# Patient Record
Sex: Female | Born: 1940 | Race: White | Hispanic: No | Smoking: Never smoker
Health system: Southern US, Community
[De-identification: ages and names within clinical notes are randomized; demographics above are authoritative.]

## PROBLEM LIST (undated history)

## (undated) DIAGNOSIS — M1990 Unspecified osteoarthritis, unspecified site: Secondary | ICD-10-CM

## (undated) DIAGNOSIS — I10 Essential (primary) hypertension: Secondary | ICD-10-CM

## (undated) DIAGNOSIS — I7300 Raynaud's syndrome without gangrene: Secondary | ICD-10-CM

## (undated) DIAGNOSIS — J449 Chronic obstructive pulmonary disease, unspecified: Secondary | ICD-10-CM

## (undated) DIAGNOSIS — J439 Emphysema, unspecified: Secondary | ICD-10-CM

## (undated) DIAGNOSIS — C07 Malignant neoplasm of parotid gland: Secondary | ICD-10-CM

## (undated) DIAGNOSIS — E785 Hyperlipidemia, unspecified: Secondary | ICD-10-CM

## (undated) DIAGNOSIS — J189 Pneumonia, unspecified organism: Secondary | ICD-10-CM

## (undated) DIAGNOSIS — R1310 Dysphagia, unspecified: Secondary | ICD-10-CM

## (undated) DIAGNOSIS — Y92009 Unspecified place in unspecified non-institutional (private) residence as the place of occurrence of the external cause: Secondary | ICD-10-CM

## (undated) DIAGNOSIS — K219 Gastro-esophageal reflux disease without esophagitis: Secondary | ICD-10-CM

## (undated) DIAGNOSIS — W19XXXA Unspecified fall, initial encounter: Secondary | ICD-10-CM

## (undated) DIAGNOSIS — R0981 Nasal congestion: Secondary | ICD-10-CM

## (undated) DIAGNOSIS — C801 Malignant (primary) neoplasm, unspecified: Secondary | ICD-10-CM

## (undated) HISTORY — DX: Gastro-esophageal reflux disease without esophagitis: K21.9

## (undated) HISTORY — PX: APPENDECTOMY: SHX54

## (undated) HISTORY — DX: Dysphagia, unspecified: R13.10

## (undated) HISTORY — DX: Unspecified osteoarthritis, unspecified site: M19.90

## (undated) HISTORY — PX: OTHER SURGICAL HISTORY: SHX169

## (undated) HISTORY — DX: Essential (primary) hypertension: I10

## (undated) HISTORY — DX: Hyperlipidemia, unspecified: E78.5

## (undated) HISTORY — PX: PAROTIDECTOMY: SUR1003

## (undated) HISTORY — DX: Nasal congestion: R09.81

## (undated) HISTORY — PX: TONSILLECTOMY: SUR1361

## (undated) HISTORY — DX: Malignant neoplasm of parotid gland: C07

## (undated) HISTORY — DX: Pneumonia, unspecified organism: J18.9

## (undated) HISTORY — DX: Malignant (primary) neoplasm, unspecified: C80.1

---

## 1999-03-13 ENCOUNTER — Ambulatory Visit (HOSPITAL_COMMUNITY): Admission: RE | Admit: 1999-03-13 | Discharge: 1999-03-13 | Payer: Self-pay | Admitting: Oncology

## 1999-03-13 ENCOUNTER — Encounter (HOSPITAL_COMMUNITY): Payer: Self-pay | Admitting: Oncology

## 1999-04-14 ENCOUNTER — Other Ambulatory Visit: Admission: RE | Admit: 1999-04-14 | Discharge: 1999-04-14 | Payer: Self-pay | Admitting: *Deleted

## 2000-04-21 ENCOUNTER — Other Ambulatory Visit: Admission: RE | Admit: 2000-04-21 | Discharge: 2000-04-21 | Payer: Self-pay | Admitting: *Deleted

## 2000-09-24 ENCOUNTER — Ambulatory Visit (HOSPITAL_COMMUNITY): Admission: RE | Admit: 2000-09-24 | Discharge: 2000-09-24 | Payer: Self-pay | Admitting: *Deleted

## 2001-04-25 ENCOUNTER — Other Ambulatory Visit: Admission: RE | Admit: 2001-04-25 | Discharge: 2001-04-25 | Payer: Self-pay | Admitting: *Deleted

## 2001-07-20 ENCOUNTER — Encounter: Admission: RE | Admit: 2001-07-20 | Discharge: 2001-07-20 | Payer: Self-pay | Admitting: Internal Medicine

## 2001-07-20 ENCOUNTER — Encounter: Payer: Self-pay | Admitting: Internal Medicine

## 2001-08-24 ENCOUNTER — Encounter (INDEPENDENT_AMBULATORY_CARE_PROVIDER_SITE_OTHER): Payer: Self-pay | Admitting: *Deleted

## 2001-08-24 ENCOUNTER — Ambulatory Visit (HOSPITAL_COMMUNITY): Admission: RE | Admit: 2001-08-24 | Discharge: 2001-08-24 | Payer: Self-pay | Admitting: *Deleted

## 2001-09-16 ENCOUNTER — Ambulatory Visit (HOSPITAL_COMMUNITY): Admission: RE | Admit: 2001-09-16 | Discharge: 2001-09-16 | Payer: Self-pay | Admitting: Oncology

## 2001-09-16 ENCOUNTER — Encounter (HOSPITAL_COMMUNITY): Payer: Self-pay | Admitting: Oncology

## 2002-04-27 ENCOUNTER — Other Ambulatory Visit: Admission: RE | Admit: 2002-04-27 | Discharge: 2002-04-27 | Payer: Self-pay | Admitting: *Deleted

## 2003-03-09 ENCOUNTER — Encounter (HOSPITAL_COMMUNITY): Payer: Self-pay | Admitting: Oncology

## 2003-03-09 ENCOUNTER — Ambulatory Visit (HOSPITAL_COMMUNITY): Admission: RE | Admit: 2003-03-09 | Discharge: 2003-03-09 | Payer: Self-pay | Admitting: Oncology

## 2003-05-07 ENCOUNTER — Other Ambulatory Visit: Admission: RE | Admit: 2003-05-07 | Discharge: 2003-05-07 | Payer: Self-pay | Admitting: *Deleted

## 2004-03-10 ENCOUNTER — Ambulatory Visit: Admission: RE | Admit: 2004-03-10 | Discharge: 2004-03-10 | Payer: Self-pay | Admitting: Internal Medicine

## 2004-03-10 ENCOUNTER — Ambulatory Visit (HOSPITAL_COMMUNITY): Admission: RE | Admit: 2004-03-10 | Discharge: 2004-03-10 | Payer: Self-pay | Admitting: Oncology

## 2004-05-07 ENCOUNTER — Other Ambulatory Visit: Admission: RE | Admit: 2004-05-07 | Discharge: 2004-05-07 | Payer: Self-pay | Admitting: *Deleted

## 2004-07-29 ENCOUNTER — Ambulatory Visit (HOSPITAL_COMMUNITY): Admission: RE | Admit: 2004-07-29 | Discharge: 2004-07-29 | Payer: Self-pay | Admitting: Internal Medicine

## 2004-08-26 ENCOUNTER — Ambulatory Visit (HOSPITAL_COMMUNITY): Admission: RE | Admit: 2004-08-26 | Discharge: 2004-08-26 | Payer: Self-pay | Admitting: Internal Medicine

## 2005-02-27 ENCOUNTER — Ambulatory Visit: Payer: Self-pay | Admitting: Oncology

## 2005-03-02 ENCOUNTER — Ambulatory Visit (HOSPITAL_COMMUNITY): Admission: RE | Admit: 2005-03-02 | Discharge: 2005-03-02 | Payer: Self-pay | Admitting: Oncology

## 2005-05-07 ENCOUNTER — Other Ambulatory Visit: Admission: RE | Admit: 2005-05-07 | Discharge: 2005-05-07 | Payer: Self-pay | Admitting: *Deleted

## 2006-01-19 ENCOUNTER — Ambulatory Visit (HOSPITAL_COMMUNITY): Admission: RE | Admit: 2006-01-19 | Discharge: 2006-01-19 | Payer: Self-pay | Admitting: Internal Medicine

## 2006-01-25 ENCOUNTER — Ambulatory Visit (HOSPITAL_COMMUNITY): Admission: RE | Admit: 2006-01-25 | Discharge: 2006-01-25 | Payer: Self-pay | Admitting: *Deleted

## 2006-02-24 ENCOUNTER — Ambulatory Visit: Payer: Self-pay | Admitting: Oncology

## 2006-03-02 LAB — COMPREHENSIVE METABOLIC PANEL
AST: 25 U/L (ref 0–37)
Alkaline Phosphatase: 86 U/L (ref 39–117)
Chloride: 106 mEq/L (ref 96–112)
Creatinine, Ser: 1.03 mg/dL (ref 0.40–1.20)
Glucose, Bld: 65 mg/dL — ABNORMAL LOW (ref 70–99)
Sodium: 143 mEq/L (ref 135–145)

## 2006-03-02 LAB — CBC WITH DIFFERENTIAL/PLATELET
Basophils Absolute: 0 10*3/uL (ref 0.0–0.1)
EOS%: 1.9 % (ref 0.0–7.0)
Eosinophils Absolute: 0.1 10*3/uL (ref 0.0–0.5)
HGB: 12.8 g/dL (ref 11.6–15.9)
LYMPH%: 27.3 % (ref 14.0–48.0)
MCHC: 34.4 g/dL (ref 32.0–36.0)
MONO#: 0.3 10*3/uL (ref 0.1–0.9)
MONO%: 9.5 % (ref 0.0–13.0)
NEUT#: 2 10*3/uL (ref 1.5–6.5)
WBC: 3.4 10*3/uL — ABNORMAL LOW (ref 3.9–10.0)

## 2006-03-02 LAB — LACTATE DEHYDROGENASE: LDH: 160 U/L (ref 94–250)

## 2006-05-13 ENCOUNTER — Other Ambulatory Visit: Admission: RE | Admit: 2006-05-13 | Discharge: 2006-05-13 | Payer: Self-pay | Admitting: *Deleted

## 2007-02-25 ENCOUNTER — Ambulatory Visit: Payer: Self-pay | Admitting: Oncology

## 2007-03-01 ENCOUNTER — Ambulatory Visit (HOSPITAL_COMMUNITY): Admission: RE | Admit: 2007-03-01 | Discharge: 2007-03-01 | Payer: Self-pay | Admitting: Oncology

## 2007-03-01 LAB — COMPREHENSIVE METABOLIC PANEL
ALT: 17 U/L (ref 0–35)
Alkaline Phosphatase: 83 U/L (ref 39–117)
BUN: 29 mg/dL — ABNORMAL HIGH (ref 6–23)
CO2: 25 mEq/L (ref 19–32)
Creatinine, Ser: 1.03 mg/dL (ref 0.40–1.20)
Glucose, Bld: 90 mg/dL (ref 70–99)
Potassium: 4.3 mEq/L (ref 3.5–5.3)
Sodium: 143 mEq/L (ref 135–145)

## 2007-03-01 LAB — CBC WITH DIFFERENTIAL/PLATELET
BASO%: 0.5 % (ref 0.0–2.0)
HGB: 12.3 g/dL (ref 11.6–15.9)
LYMPH%: 24.8 % (ref 14.0–48.0)
MCHC: 34.6 g/dL (ref 32.0–36.0)
MONO%: 9.9 % (ref 0.0–13.0)
NEUT%: 61.3 % (ref 39.6–76.8)
WBC: 3.9 10*3/uL (ref 3.9–10.0)

## 2007-03-01 LAB — LACTATE DEHYDROGENASE: LDH: 150 U/L (ref 94–250)

## 2007-05-19 ENCOUNTER — Other Ambulatory Visit: Admission: RE | Admit: 2007-05-19 | Discharge: 2007-05-19 | Payer: Self-pay | Admitting: *Deleted

## 2007-11-13 ENCOUNTER — Emergency Department (HOSPITAL_COMMUNITY): Admission: EM | Admit: 2007-11-13 | Discharge: 2007-11-13 | Payer: Self-pay | Admitting: Family Medicine

## 2008-02-02 ENCOUNTER — Ambulatory Visit (HOSPITAL_COMMUNITY): Admission: RE | Admit: 2008-02-02 | Discharge: 2008-02-02 | Payer: Self-pay | Admitting: *Deleted

## 2008-02-02 ENCOUNTER — Encounter (INDEPENDENT_AMBULATORY_CARE_PROVIDER_SITE_OTHER): Payer: Self-pay | Admitting: *Deleted

## 2008-02-24 ENCOUNTER — Ambulatory Visit: Payer: Self-pay | Admitting: Oncology

## 2008-02-28 LAB — COMPREHENSIVE METABOLIC PANEL
AST: 20 U/L (ref 0–37)
Alkaline Phosphatase: 85 U/L (ref 39–117)
BUN: 22 mg/dL (ref 6–23)
Calcium: 9.1 mg/dL (ref 8.4–10.5)
Creatinine, Ser: 1.07 mg/dL (ref 0.40–1.20)
Potassium: 4.5 mEq/L (ref 3.5–5.3)
Total Bilirubin: 0.5 mg/dL (ref 0.3–1.2)
Total Protein: 6.4 g/dL (ref 6.0–8.3)

## 2008-02-28 LAB — CBC WITH DIFFERENTIAL/PLATELET
EOS%: 6.7 % (ref 0.0–7.0)
HCT: 37.6 % (ref 34.8–46.6)
HGB: 12.8 g/dL (ref 11.6–15.9)
LYMPH%: 19.7 % (ref 14.0–48.0)
MCH: 31.5 pg (ref 26.0–34.0)
MCHC: 34.2 g/dL (ref 32.0–36.0)
MCV: 92.2 fL (ref 81.0–101.0)
Platelets: 266 10*3/uL (ref 145–400)
RBC: 4.07 10*6/uL (ref 3.70–5.32)
WBC: 4 10*3/uL (ref 3.9–10.0)

## 2008-05-21 ENCOUNTER — Other Ambulatory Visit: Admission: RE | Admit: 2008-05-21 | Discharge: 2008-05-21 | Payer: Self-pay | Admitting: Gynecology

## 2009-03-06 ENCOUNTER — Ambulatory Visit: Payer: Self-pay | Admitting: Oncology

## 2009-03-08 ENCOUNTER — Ambulatory Visit (HOSPITAL_COMMUNITY): Admission: RE | Admit: 2009-03-08 | Discharge: 2009-03-08 | Payer: Self-pay | Admitting: Oncology

## 2009-03-08 LAB — COMPREHENSIVE METABOLIC PANEL WITH GFR
ALT: 18 U/L (ref 0–35)
AST: 21 U/L (ref 0–37)
Albumin: 3.9 g/dL (ref 3.5–5.2)
Alkaline Phosphatase: 75 U/L (ref 39–117)
BUN: 23 mg/dL (ref 6–23)
CO2: 26 meq/L (ref 19–32)
Calcium: 9.1 mg/dL (ref 8.4–10.5)
Chloride: 110 meq/L (ref 96–112)
Creatinine, Ser: 1.08 mg/dL (ref 0.40–1.20)
Glucose, Bld: 78 mg/dL (ref 70–99)
Potassium: 4.6 meq/L (ref 3.5–5.3)
Sodium: 145 meq/L (ref 135–145)
Total Bilirubin: 0.4 mg/dL (ref 0.3–1.2)
Total Protein: 6.2 g/dL (ref 6.0–8.3)

## 2009-03-08 LAB — CBC WITH DIFFERENTIAL/PLATELET
BASO%: 0.4 % (ref 0.0–2.0)
Basophils Absolute: 0 10*3/uL (ref 0.0–0.1)
EOS%: 5.9 % (ref 0.0–7.0)
Eosinophils Absolute: 0.2 10*3/uL (ref 0.0–0.5)
HCT: 36.5 % (ref 34.8–46.6)
HGB: 12.7 g/dL (ref 11.6–15.9)
LYMPH%: 22.5 % (ref 14.0–49.7)
MCH: 32.7 pg (ref 25.1–34.0)
MCHC: 34.8 g/dL (ref 31.5–36.0)
MCV: 93.8 fL (ref 79.5–101.0)
MONO#: 0.4 10*3/uL (ref 0.1–0.9)
MONO%: 11.2 % (ref 0.0–14.0)
NEUT#: 2.2 10*3/uL (ref 1.5–6.5)
NEUT%: 60 % (ref 38.4–76.8)
Platelets: 241 10*3/uL (ref 145–400)
RBC: 3.89 10*6/uL (ref 3.70–5.45)
RDW: 12.7 % (ref 11.2–14.5)
WBC: 3.6 10*3/uL — ABNORMAL LOW (ref 3.9–10.3)
lymph#: 0.8 10*3/uL — ABNORMAL LOW (ref 0.9–3.3)

## 2009-03-08 LAB — LACTATE DEHYDROGENASE: LDH: 180 U/L (ref 94–250)

## 2010-01-06 ENCOUNTER — Encounter: Admission: RE | Admit: 2010-01-06 | Discharge: 2010-01-06 | Payer: Self-pay | Admitting: Internal Medicine

## 2010-02-07 ENCOUNTER — Ambulatory Visit: Payer: Self-pay | Admitting: Thoracic Surgery

## 2010-02-14 ENCOUNTER — Ambulatory Visit: Payer: Self-pay | Admitting: Oncology

## 2010-02-17 LAB — CBC WITH DIFFERENTIAL/PLATELET
Basophils Absolute: 0 10*3/uL (ref 0.0–0.1)
HCT: 39.4 % (ref 34.8–46.6)
MCHC: 33.9 g/dL (ref 31.5–36.0)
MCV: 95.4 fL (ref 79.5–101.0)
MONO#: 0.3 10*3/uL (ref 0.1–0.9)
NEUT#: 4.3 10*3/uL (ref 1.5–6.5)
Platelets: 224 10*3/uL (ref 145–400)
RBC: 4.13 10*6/uL (ref 3.70–5.45)
WBC: 6.1 10*3/uL (ref 3.9–10.3)
lymph#: 1.3 10*3/uL (ref 0.9–3.3)

## 2010-02-17 LAB — COMPREHENSIVE METABOLIC PANEL
AST: 28 U/L (ref 0–37)
Alkaline Phosphatase: 94 U/L (ref 39–117)
CO2: 29 mEq/L (ref 19–32)
Chloride: 105 mEq/L (ref 96–112)
Creatinine, Ser: 0.97 mg/dL (ref 0.40–1.20)
Potassium: 4.3 mEq/L (ref 3.5–5.3)
Sodium: 140 mEq/L (ref 135–145)
Total Protein: 7.1 g/dL (ref 6.0–8.3)

## 2010-02-17 LAB — LACTATE DEHYDROGENASE: LDH: 177 U/L (ref 94–250)

## 2010-06-24 ENCOUNTER — Encounter: Admission: RE | Admit: 2010-06-24 | Discharge: 2010-06-24 | Payer: Self-pay | Admitting: Thoracic Surgery

## 2010-06-25 ENCOUNTER — Ambulatory Visit: Payer: Self-pay | Admitting: Thoracic Surgery

## 2011-02-10 NOTE — Op Note (Signed)
NAME:  Lisa Newman, Lisa Newman NO.:  [id number]   MEDICAL RECORD NO.:  [id number]          PATIENT TYPE:  AMB   LOCATION:  ENDO                         FACILITY:  [facility]   PHYSICIAN:  Georgiana Spinner, M.D.    DATE OF BIRTH:  02/15/1941   DATE OF PROCEDURE:  02/02/2008  DATE OF DISCHARGE:                               OPERATIVE REPORT   PROCEDURE:  Upper endoscopy.   INDICATIONS:  Abdominal pain.   ANESTHESIA:  Fentanyl 50 mcg, Versed 5 mg.   PROCEDURE:  With the patient mildly sedated in the left lateral  decubitus position, the Pentax videoscopic endoscope was inserted in the  mouth passed under direct vision through the esophagus, which appeared  normal.  The distal esophagus showed a possible area of Barrett's, that  was photographed and biopsied.  We entered into the stomach fundus,  body, antrum, duodenal bulb and second portion of the duodenum were  visualized and appeared normal.  From this point the endoscope was  slowly withdrawn, taking circumferential views of the duodenal mucosa;  until the endoscope had been pulled back into the stomach and placed in  retroflexion to view the stomach from below.  The endoscope was  straightened and withdrawn, taking circumferential views of the  remaining gastric and esophageal mucosa.  The patient's vital signs and  pulse oximetry remained stable.  The patient tolerated the procedure  well without apparent complications.   FINDINGS:  Loose wrap of the GE junction around the endoscope,  indicating laxity of the lower esophageal sphincter.  Question of  Barrett's esophagus, biopsied.   PLAN:  Await biopsy report and proceed with CT scan for evaluation of  the continued abdominal pain.           ______________________________  Georgiana Spinner, M.D.     GMO/MEDQ  D:  02/02/2008  T:  02/02/2008  Job:  [id number]

## 2011-02-10 NOTE — Letter (Signed)
June 25, 2010   Massie Maroon, MD  [address]  [place], [place] [address]   Re:  Lisa Newman, SCHULER                  DOB:  Aug 29, 1941   Dear Dr. [name]:   I saw the patient back today and a CT scan showed that the oval-shaped  nodule in the left lower lobe has resolved.  There is some tree-in-bud  opacity in both lower lobes, which go along with inflammatory process.  Her thyroid was not identified here, thus being followed.  I do not  believe there is any other reason to follow her as I think her changes  in her lung appear all to be inflammatory.  I would suggest that she get  at least a chest x-ray once a year.  I will be happy to see her again if  she has any other problems.   Ines Bloomer, M.D.  Electronically Signed   DPB/MEDQ  D:  06/25/2010  T:  06/26/2010  Job:  [id number]

## 2011-02-10 NOTE — Letter (Signed)
Feb 07, 2010   Massie Maroon, MD  [address]  [place], [place] [address]   Re:  Lisa Newman, ACHORN                  DOB:  02/21/1941   Dear Dr. [name]:   I saw the patient today.  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  She recently had some  allergies and a cough and was treated with bronchitis.  Chest x-ray  showed a right upper lobe apical lesion.  CT scan showed a right upper  lobe lesion with questionable scarring and a left lower lobe 5- x 8-mm  indistinct nodule.  She was referred here for evaluation.  She is a  nonsmoker.  She does not drink alcohol on a regular basis.  She has had  no hemoptysis, fever, chills, excessive sputum.   PAST MEDICAL HISTORY:  She has hypertension, hypercholesterolemia.   MEDICATIONS:  1. She takes Avapro 300 mg a day.  2. Pravastatin 20 mg a day.  3. Calcium.  4. Vitamin D.  5. Multivitamins.  6. Aspirin.  7. Singulair 10 mg a day.  8. B12.  9. Omega-3.  10.Vitamin C.   ALLERGIES:  IBUPROFEN causes a rash.   Family history is positive for cardiac disease.   SOCIAL HISTORY:  She is single, retired.  Smoked as a teenager.  Does  not drink alcohol.   REVIEW OF SYSTEMS:  CONSTITUTIONAL:  She is 105 pounds.  She is 5 feet 1  inch.  GENERAL:  Her weight is stable.  CARDIAC:  No angina or atrial fibrillation.  PULMONARY:  Asthma and wheezing.  GI:  Some dysphagia secondary to her previous surgery.  GU:  No kidney disease, dysuria, or frequent urination.  VASCULAR:  No claudication, DVT, TIAs.  NEUROLOGIC:  No dizziness, headaches, blackouts, or seizures.  MUSCULOSKELETAL:  No arthritis.  PSYCHIATRIC:  No depression.  No nervousness.  EYE/ENT:  See history of present illness.  No changes in her eyesight.  HEMATOLOGIC:  No problems with bleeding,  clotting disorders, or anemia.   PHYSICAL EXAMINATION:  General:  She is a well-developed Caucasian female  in no female in no acute distress.  Vital Signs:  Her blood pressure is  153/73, pulse 64, respirations 18, sats were 95%.  Head, Eyes:  Unremarkable.  Nose:  No septal deviation.  Ears:  Tympanic membranes  are intact.  There is a partial resection of her right pinna.  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  Chest:  Clear to  auscultation and percussion.  Heart:  Regular sinus rhythm.  No murmurs.  Abdomen:  Soft.  There is no splenomegaly.  Extremities:  Pulses 2+.  There is no clubbing or edema.   ASSESSMENT AND PLAN:  I think this right upper lobe scar is probably  related to her radiation that she had in 1997.  This small left lower  lobe nodule is the only thing of possible concern, and I think that can  be safely followed.  I will see her again in 4 months with a noncontrast  CT.   Sincerely,   Ines Bloomer, M.D.  Electronically Signed  DPB/MEDQ  D:  02/07/2010  T:  02/07/2010  Job:  [id number]

## 2011-02-13 NOTE — Op Note (Signed)
NAMEMarland Newman  [name], MUCHMORE NO.:  [id number]   MEDICAL RECORD NO.:  [id number]          PATIENT TYPE:  AMB   LOCATION:  ENDO                         FACILITY:  [facility]   PHYSICIAN:  Georgiana Spinner, M.D.    DATE OF BIRTH:  02/02/1941   DATE OF PROCEDURE:  01/25/2006  DATE OF DISCHARGE:                                 OPERATIVE REPORT   PROCEDURE:  Colonoscopy.   INDICATIONS:  Colon polyps.   ANESTHESIA:  Fentanyl 25 mcg, Versed 5 mg.   PROCEDURE:  The patient mildly sedated in the left lateral decubitus  position, the videoscopic colonoscope was inserted in the rectum and passed  under direct vision with pressure applied to the abdomen until we reached  the cecum identified by ileocecal valve and appendiceal orifice, both of  which were photographed and there was transillumination in the very right  lower quadrant.  From this point colonoscope was slowly withdrawn taking  circumferential views of colonic mucosa stopping in the rectum which  appeared normal on direct and retroflexed view. The endoscope was  straightened, withdrawn.  The patient's vital signs, pulse oximeter remained  stable.  The patient tolerated procedure well without complications.   FINDINGS:  Unremarkable examination.  Of note there was somewhat of a lax  anal sphincter.           ______________________________  Georgiana Spinner, M.D.     GMO/MEDQ  D:  01/25/2006  T:  01/25/2006  Job:  [id number]

## 2011-02-13 NOTE — Procedures (Signed)
[facility]  Patient:    MASAE, LUKACS                   MRN: [id number] Proc. Date: 09/24/00 Adm. Date:  [id number] Disc. Date: [id number] Attending:  Sabino Gasser                           Procedure Report  PROCEDURE:  Colonoscopy.  INDICATIONS:  Rectal bleeding.  ANESTHESIA:  Demerol 50 mg, Versed 5 mg.  DESCRIPTION OF PROCEDURE:  With the patient mildly sedated in the left lateral decubitus position, the Olympus videoscopic colonoscope was inserted into the rectum and passed under direct vision to the cecum identified by the ileocecal valve and appendiceal orifice, both of which were photographed.  From this point the colonoscope was slowly withdrawn taking circumferential views of the entire colonic mucosa and stopping only in the rectum which appeared normal in direct and retroflexed views.  The endoscope was straightened and withdrawn.  The patients vital signs and pulse oximetry remained stable.  The patient tolerated the procedure well and without apparent complications.  FINDINGS:  Normal negative colonoscopic examination.  PLAN:  Repeat examination in five years. DD:  09/24/00 TD:  09/24/00 Job: [id number]

## 2011-02-13 NOTE — Procedures (Signed)
[facility]  Patient:    Lisa Newman, Lisa Newman Visit Number: [id number] MRN: [id number]          Service Type: END Location: ENDO Attending Physician:  Sabino Gasser Dictated by:   Sabino Gasser, M.D. Proc. Date: 08/24/01 Admit Date:  08/24/2001                             Procedure Report  PROCEDURE:  Upper endoscopy.  INDICATION:  Abdominal pain.  ANESTHESIA:  Demerol 50 mg, Versed 6 mg.  DESCRIPTION OF PROCEDURE:  With the patient mildly sedated in the left lateral decubitus position, the Olympus videoscopic endoscope was inserted in the mouth, passed under direct vision through the esophagus, which appeared normal, into the stomach. The fundus, body, antrum, duodenal bulb, second portion of duodenum were visualized. From this point, the endoscope was then slowly withdrawn and taken circumferentially to view the entire duodenal mucosa until the endoscope was pulled back into the stomach, placed in retroflexion to view the stomach from below. The endoscope was then straightened, withdrawn, taking circumferential views of the remaining gastric and esophageal mucosa stopping the fundus where there were two folds together that were slightly thickened where the mucosa appeared to be possible effaced. They were further grafted and biopsied. The endoscope was withdrawn. The patients vital signs and pulse oximeter remained stable. The patient tolerated the procedure well without apparent complications.  FINDINGS:  Questionable mild inflammatory changes of two adjacent fundic folds but otherwise an unremarkable examination.  PLAN:  Await biopsy report. The patient will call Dr. Virginia Rochester for results and following with Dr. Virginia Rochester as an outpatient. Dictated by:   Sabino Gasser, M.D. Attending Physician:  Sabino Gasser DD:  08/24/01 TD:  08/24/01 Job: [id number]

## 2011-06-16 ENCOUNTER — Other Ambulatory Visit (HOSPITAL_COMMUNITY): Payer: Self-pay | Admitting: Gastroenterology

## 2011-07-01 ENCOUNTER — Ambulatory Visit (HOSPITAL_COMMUNITY)
Admission: RE | Admit: 2011-07-01 | Discharge: 2011-07-01 | Disposition: A | Discharge status: Home / Self Care | Payer: Medicare Other | Source: Ambulatory Visit | Attending: Gastroenterology | Admitting: Gastroenterology

## 2011-07-01 DIAGNOSIS — R109 Unspecified abdominal pain: Secondary | ICD-10-CM | POA: Insufficient documentation

## 2011-07-01 DIAGNOSIS — Q619 Cystic kidney disease, unspecified: Secondary | ICD-10-CM | POA: Insufficient documentation

## 2011-07-01 DIAGNOSIS — Q6100 Congenital renal cyst, unspecified: Secondary | ICD-10-CM | POA: Insufficient documentation

## 2011-07-01 DIAGNOSIS — N281 Cyst of kidney, acquired: Secondary | ICD-10-CM | POA: Insufficient documentation

## 2011-07-01 MED ORDER — SINCALIDE 5 MCG IJ SOLR: 0.0200 ug/kg | Freq: Once | INTRAMUSCULAR | Status: DC

## 2011-07-01 MED ORDER — TECHNETIUM TC 99M MEBROFENIN IV KIT
5.0000 | PACK | Freq: Once | INTRAVENOUS | Status: AC | PRN
  Administered 2011-07-01: 5 via INTRAVENOUS

## 2011-07-27 ENCOUNTER — Encounter (INDEPENDENT_AMBULATORY_CARE_PROVIDER_SITE_OTHER): Payer: Self-pay | Admitting: General Surgery

## 2011-07-28 ENCOUNTER — Ambulatory Visit (INDEPENDENT_AMBULATORY_CARE_PROVIDER_SITE_OTHER): Payer: Medicare Other | Admitting: General Surgery

## 2011-07-28 ENCOUNTER — Encounter (INDEPENDENT_AMBULATORY_CARE_PROVIDER_SITE_OTHER): Payer: Self-pay | Admitting: General Surgery

## 2011-07-28 VITALS — BP 132/74 | HR 68 | Temp 96.9°F | Resp 16 | Ht 61.5 in | Wt 104.8 lb

## 2011-07-28 DIAGNOSIS — K828 Other specified diseases of gallbladder: Secondary | ICD-10-CM

## 2011-07-28 MED ORDER — ESOMEPRAZOLE MAGNESIUM 40 MG PO CPDR: 40.0000 mg | DELAYED_RELEASE_CAPSULE | Freq: Every day | ORAL | Status: DC

## 2011-07-28 NOTE — Progress Notes (Addendum)
Chief Complaint  Patient presents with  . Other    Eval of gallbladder    HISTORY: Pt presents with epigastric and chest discomfort as well as heartburn.  She also has had some nausea.  She underwent HIDA scan and had a low ejection fraction of 22%. She has had some improvement of symptoms since starting on nexium.  She denies fevers/chills.    Past Medical History  Diagnosis Date  . Asthma   . Pneumonia   . Arthritis   . Hypertension   . Hyperlipidemia   . Carcinoma of parotid gland   . Carcinoma     parotid  . GERD (gastroesophageal reflux disease)   . Osteoporosis   . Constipation   . Nasal congestion   . Trouble swallowing     Past Surgical History  Procedure Date  . Parotid biopsy   . Appendectomy   . Myomectomy   . Tonsillectomy   . Parotidectomy     Current Outpatient Prescriptions  Medication Sig Dispense Refill  . aspirin 81 MG tablet Take 81 mg by mouth daily.        . Calcium Carbonate-Vitamin D (CALTRATE 600+D PO) Take by mouth daily.        . Cholecalciferol (VITAMIN D PO) Take by mouth daily.        . fish oil-omega-3 fatty acids 1000 MG capsule Take 1 g by mouth daily.        . Multiple Vitamin (MULTIVITAMIN PO) Take by mouth daily.        . pravastatin (PRAVACHOL) 20 MG tablet Take 20 mg by mouth daily.        Marland Kitchen BENICAR 40 MG tablet 40 mg daily.       Marland Kitchen omeprazole (PRILOSEC) 20 MG capsule 20 mg daily. Patient cuts pill in half.         Allergies  Allergen Reactions  . Ibuprofen Itching    On bottom of feet and palms of hands.     History reviewed. No pertinent family history.   History   Social History  . Marital Status: Divorced    Spouse Name: N/A    Number of Children: N/A  . Years of Education: N/A   Social History Main Topics  . Smoking status: Former Smoker    Quit date: 07/27/1965  . Smokeless tobacco: Never Used  . Alcohol Use: No  . Drug Use: No  . Sexually Active: None   Other Topics Concern  . None   Social  History Narrative  . None     REVIEW OF SYSTEMS - PERTINENT POSITIVES ONLY: 12 point review of systems negative other than HPI and PMH.  EXAM: Filed Vitals:   07/28/11 1324  BP: 132/74  Pulse: 68  Temp: 96.9 F (36.1 C)  Resp: 16    Gen:  No acute distress.  Well nourished and well groomed.   Neurological: Alert and oriented to person, place, and time. Coordination normal.  Head: Normocephalic and atraumatic.  Eyes: Conjunctivae are normal. Pupils are equal, round, and reactive to light. No scleral icterus.  Neck: Normal range of motion. Neck supple. No tracheal deviation or thyromegaly present.  Cardiovascular: Normal rate, regular rhythm, normal heart sounds and intact distal pulses.  Exam reveals no gallop and no friction rub.  No murmur heard. Respiratory: Effort normal.  No respiratory distress. No chest wall tenderness. Breath sounds normal.  No wheezes, rales or rhonchi.  GI: Soft. Bowel sounds are normal. The abdomen is soft  and nontender.  There is no rebound and no guarding.  Musculoskeletal: Normal range of motion. Extremities are nontender.  Lymphadenopathy: No cervical, preauricular, postauricular or axillary adenopathy is present Skin: Skin is warm and dry. No rash noted. No diaphoresis. No erythema. No pallor. No clubbing, cyanosis, or edema.   Psychiatric: Normal mood and affect. Behavior is normal. Judgment and thought content normal.    RADIOLOGY RESULTS: HIDA w CCK 07/01/2011  Normal patency GB EF 22%, no symptoms w CCK  RUQ ultrasound  07/01/2011 Negative other than simple L kidney cyst.    ASSESSMENT AND PLAN:   Biliary dyskinesia I think patient's abdominal pain is from the gallbladder.  She is tender in the RUQ.   She also, however, has a reflux component.   The patient felt much better on proton pump inhibitors and would like a longer trial before proceeding with cholecystectomy. She also thinks that she doesn't feel quite bad enough to have surgery  at this time.   She is already on a lowfat diet and doesn't eat spicy foods, so food avoidance will not help her be less symptomatic.        Maudry Diego MD Surgical Oncology, General and Endocrine Surgery [facility]      Visit Diagnoses: 1. Biliary dyskinesia     Primary Care Physician: Pearson Grippe, MD, MD

## 2011-07-28 NOTE — Assessment & Plan Note (Signed)
I think patient's abdominal pain is from the gallbladder.  She is tender in the RUQ.   She also, however, has a reflux component.   The patient felt much better on proton pump inhibitors and would like a longer trial before proceeding with cholecystectomy. She also thinks that she doesn't feel quite bad enough to have surgery at this time.   She is already on a lowfat diet and doesn't eat spicy foods, so food avoidance will not help her be less symptomatic.

## 2011-07-28 NOTE — Patient Instructions (Signed)
Try one month of Nexium and follow up with me in 4-6 weeks.

## 2011-09-03 ENCOUNTER — Ambulatory Visit (INDEPENDENT_AMBULATORY_CARE_PROVIDER_SITE_OTHER): Payer: Medicare Other | Admitting: General Surgery

## 2011-09-03 ENCOUNTER — Encounter (INDEPENDENT_AMBULATORY_CARE_PROVIDER_SITE_OTHER): Payer: Self-pay | Admitting: General Surgery

## 2011-09-03 VITALS — BP 128/70 | HR 68 | Temp 97.8°F | Resp 12 | Ht 61.5 in | Wt 104.2 lb

## 2011-09-03 DIAGNOSIS — K828 Other specified diseases of gallbladder: Secondary | ICD-10-CM

## 2011-09-03 NOTE — Patient Instructions (Signed)
Continue nexium or double dose over the counter prilosec.  Follow up with me in April

## 2011-09-03 NOTE — Progress Notes (Signed)
HISTORY: Pt is doing better since she started on PPI.  Her symptoms now are rare episodes of nausea and occasional soreness at the navel.  She is not having any fevers/chills.  She continues on an lowfat diet.  She does not eat spicy or fatty foods.    PERTINENT REVIEW OF SYSTEMS: Otherwise negative.    EXAM: Head: Pt with sequelae of extensive surgery on face.   Eyes:  Conjunctivae are normal.  Neck:  Normal range of motion. Neck supple.  Resp: No respiratory distress, normal effort. Abd:  Abdomen is soft, non distended and non tender in the RUQ. No masses are palpable.  There is no rebound and no guarding. No umbilical hernia is seen.   Neurological: Alert and oriented to person, place, and time. Coordination normal.  Skin: Skin is warm and dry. No rash noted. No diaphoretic. No erythema. No pallor.  Psychiatric: Normal mood and affect. Normal behavior. Judgment and thought content normal.    ASSESSMENT AND PLAN:   Biliary dyskinesia Pt with improvement since starting PPI. May not have to have cholecystectomy. Pt would like to touch base in spring to determine if she needs cholecystectomy.    Maudry Diego, MD Surgical Oncology, General & Endocrine Surgery [facility], Hubbard Hartshorn, MD, MD Massie Maroon., MD

## 2011-09-03 NOTE — Assessment & Plan Note (Signed)
Pt with improvement since starting PPI. May not have to have cholecystectomy. Pt would like to touch base in spring to determine if she needs cholecystectomy.

## 2011-09-17 DIAGNOSIS — H95819 Postprocedural stenosis of unspecified external ear canal: Secondary | ICD-10-CM | POA: Insufficient documentation

## 2011-09-17 DIAGNOSIS — C07 Malignant neoplasm of parotid gland: Secondary | ICD-10-CM | POA: Insufficient documentation

## 2012-02-05 ENCOUNTER — Encounter (INDEPENDENT_AMBULATORY_CARE_PROVIDER_SITE_OTHER): Payer: Self-pay | Admitting: General Surgery

## 2012-02-05 ENCOUNTER — Ambulatory Visit (INDEPENDENT_AMBULATORY_CARE_PROVIDER_SITE_OTHER): Payer: Medicare Other | Admitting: General Surgery

## 2012-02-05 VITALS — BP 134/68 | HR 73 | Temp 97.9°F | Ht 61.5 in | Wt 103.2 lb

## 2012-02-05 DIAGNOSIS — K828 Other specified diseases of gallbladder: Secondary | ICD-10-CM

## 2012-02-05 NOTE — Progress Notes (Signed)
HISTORY: Patient is a 71 year old female last saw last fall for evaluation of biliary dyskinesia. She had recently started on a proton pump inhibitor, and had started feeling significantly better. Since that time, she has stopped taking the Nexium and stopped eating so many tomatoes. She states at that time she was having so much discomfort she was eating a significant quantity of tomatoes. She described the pain as primarily being at the base of her navel.  This is primarily gone. She does not haveany nausea or discomfort after eating. She denies fevers and chills.   PERTINENT REVIEW OF SYSTEMS: Otherwise negative.    EXAM: Head: Normocephalic and post surgical.  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   Eyes:  Conjunctivae are normal. Pupils are equal, round, and reactive to light. No scleral icterus.   Resp: No respiratory distress, normal effort. Abd:  Abdomen is soft, non distended and non tender. No masses are palpable.  There is no rebound and no guarding. There is no evidence of umbilical hernia or epigastric hernia.   Neurological: Alert and oriented to person, place, and time. Coordination normal.  Skin: Skin is warm and dry. No rash noted. No diaphoretic. No erythema. No pallor.  Psychiatric: Normal mood and affect. Normal behavior. Judgment and thought content normal.      ASSESSMENT AND PLAN:   Biliary dyskinesia Currently asymptomatic.  Advised of symptoms of chronic cholecystitis. Advised to call if problems recur.  Follow up as needed.         Maudry Diego, MD Surgical Oncology, General & Endocrine Surgery [facility], Hubbard Hartshorn, MD, MD Massie Maroon., MD

## 2012-02-05 NOTE — Assessment & Plan Note (Signed)
Currently asymptomatic.  Advised of symptoms of chronic cholecystitis. Advised to call if problems recur.  Follow up as needed.

## 2012-02-05 NOTE — Patient Instructions (Signed)
Call if upper or right abdominal pain returns.

## 2012-03-24 ENCOUNTER — Telehealth: Payer: Self-pay | Admitting: Oncology

## 2012-03-24 NOTE — Telephone Encounter (Signed)
pt had called to make a f/u appt,l/m with first avail appt   aom

## 2012-05-19 ENCOUNTER — Encounter: Payer: Self-pay | Admitting: Family

## 2012-05-19 ENCOUNTER — Ambulatory Visit: Payer: Medicare Other | Admitting: Oncology

## 2012-05-19 ENCOUNTER — Ambulatory Visit (HOSPITAL_BASED_OUTPATIENT_CLINIC_OR_DEPARTMENT_OTHER): Payer: Medicare Other | Admitting: Family

## 2012-05-19 VITALS — BP 97/65 | HR 79 | Temp 97.0°F | Resp 18 | Ht 61.5 in | Wt 102.0 lb

## 2012-05-19 DIAGNOSIS — Z85819 Personal history of malignant neoplasm of unspecified site of lip, oral cavity, and pharynx: Secondary | ICD-10-CM

## 2012-05-19 DIAGNOSIS — Z85818 Personal history of malignant neoplasm of other sites of lip, oral cavity, and pharynx: Secondary | ICD-10-CM | POA: Insufficient documentation

## 2012-05-19 NOTE — Patient Instructions (Addendum)
Patient ID: Lisa Newman,   DOB: November 23, 1940,  MRN: [id number]   [facility] Discharge Instructions  RECOMMENDATIONS MAD BY THE CONSULTANT AND ANY TEST RESULT(S) WILL BE FORWARDED TO YOU REFERRING DOCTOR   EXAM FINDINGS BY NURSE PRACTITIONER TODAY TO REPORT TO THE CLINIC OR PRIMARY PROVIDER:    Your Current Medications Are: Current Outpatient Prescriptions  Medication Sig Dispense Refill  . aspirin 81 MG tablet Take 81 mg by mouth daily.        Marland Kitchen BENICAR 40 MG tablet 40 mg daily.       . Calcium Carbonate-Vitamin D (CALTRATE 600+D PO) Take by mouth daily.        . Cholecalciferol (VITAMIN D PO) Take by mouth daily.        Marland Kitchen co-enzyme Q-10 50 MG capsule Take 100 mg by mouth daily.      Marland Kitchen diltiazem (CARDIZEM) 30 MG tablet       . fexofenadine (ALLEGRA) 180 MG tablet Take 180 mg by mouth as needed.      . fish oil-omega-3 fatty acids 1000 MG capsule Take 1 g by mouth daily.        . Multiple Vitamin (MULTIVITAMIN PO) Take by mouth daily.        . pravastatin (PRAVACHOL) 20 MG tablet Take 20 mg by mouth daily.        . sertraline (ZOLOFT) 25 MG tablet Take 12.5 mg by mouth daily.      . vitamin C (ASCORBIC ACID) 500 MG tablet Take 500 mg by mouth daily.         INSTRUCTIONS GIVEN, DISCUSSED AND FOLLOW-UP: Call us if you notice any changes or have concerns.  I acknowledge that I have been informed and understand all the instructions given to me and have received a copy.  I do not have any further questions at this time, but I understand that I may call the [facility] at [phone] during business hours should I have any further questions or need assistance in obtaining follow-up care.   05/19/2012, 10:43 AM

## 2012-05-19 NOTE — Progress Notes (Signed)
Patient ID: Lisa Newman, female   DOB: 15-Dec-1940, 71 y.o.   MRN: [id number] CSN: [id number]  Cc:  Dr. Lytle Michaels. Lisa Batten [facility]) Dr. Denice Bors [facility])  Problem List: Lisa Newman is a 71 y.o. Caucasian female with a problem list consisting of:  1.  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  Initially the patient was treated with radiation and subsequently chemotherapy and radical surgery by Dr. Leland Johns.  Surgery was on 11/11/1995.  The original diagnosis was in June,1996. 2.  Wheezing 3.  HTN 4.  Dyslipidemia 5.  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  Abnormal chest CT 01/06/10 - right apex LLL nodule.  Subsequent chest xray on 06/24/10 showed resolution of the oval shaped nodule in the posterior LLL.   Lisa Newman was last seen in our office on 02/17/2010 by Dr. [name].  At that time, the patient had been 15 years from the time of diagnosis without evidence of recurrence and it was requested by the patient to not make any further appointments for follow-up.  The patient recently called our office and made an appointment.  The patient stated that she had two episodes of pneumonia (April, 2012 and June, 2013 not requiring hospitalization) and was worried that she has lung cancer.  She then stated that she had another xray taken by her PCP, Dr. [name] on 05/12/2012 and she was told that her xray was ok by her PCP, but she presents to the our office with a CD of her x-ray and requested that Dr. [name] read it.  She stated that she has not received a copy of the xray report and she is worried she has lung cancer.  The patient went on to mention that she is under a tremendous amount of stress as she is the primary caregiver to her 31 year-old mother.  The patient otherwise is without complaint and states that she  follows up regularly with Dr. [name], PCP and Dr. Hezzie Bump, Surgeon at [facility]  Past Medical History: Past Medical History  Diagnosis Date  . Asthma   . Pneumonia   . Arthritis   . Hypertension   . Hyperlipidemia   . Carcinoma of parotid gland   . Carcinoma     parotid  . GERD (gastroesophageal reflux disease)   . Osteoporosis   . Constipation   . Nasal congestion   . Trouble swallowing     Surgical History: Past Surgical History  Procedure Date  . Parotid biopsy   . Appendectomy   . Myomectomy   . Tonsillectomy   . Parotidectomy     Current Medications: Current Outpatient Prescriptions  Medication Sig Dispense Refill  . aspirin 81 MG tablet Take 81 mg by mouth daily.        Marland Kitchen BENICAR 40 MG tablet 40 mg daily.       . Calcium Carbonate-Vitamin D (CALTRATE 600+D PO) Take by mouth daily.        . Cholecalciferol (VITAMIN D PO) Take by mouth daily.        Marland Kitchen co-enzyme Q-10 50 MG capsule Take 100 mg by mouth daily.      Marland Kitchen diltiazem (CARDIZEM) 30 MG tablet       . fexofenadine (ALLEGRA) 180 MG tablet Take 180 mg by mouth as needed.      Marland Kitchen  fish oil-omega-3 fatty acids 1000 MG capsule Take 1 g by mouth daily.        . Multiple Vitamin (MULTIVITAMIN PO) Take by mouth daily.        . pravastatin (PRAVACHOL) 20 MG tablet Take 20 mg by mouth daily.        . sertraline (ZOLOFT) 25 MG tablet Take 12.5 mg by mouth daily.      . vitamin C (ASCORBIC ACID) 500 MG tablet Take 500 mg by mouth daily.        Allergies: Allergies  Allergen Reactions  . Ibuprofen Itching    On bottom of feet and palms of hands.     Family History: Family History  Problem Relation Age of Onset  . Heart disease Brother     heart attack  . Cancer Paternal Aunt     pt unaware of what kind    Social History: History  Substance Use Topics  . Smoking status: Never Smoker   . Smokeless tobacco: Never Used  . Alcohol Use: No    Review of Systems: 10 Point  review of systems was completed and is negative except as noted above.   Physical Exam:   Blood pressure 97/65, pulse 79, temperature 97 F (36.1 C), temperature source Oral, resp. rate 18, height 5' 1.5" (1.562 m), weight 102 lb (46.267 kg).  General appearance: Alert, cooperative, well nourished, mild distress Head: Normocephalic, without obvious abnormality, atraumatic Eyes: Conjunctivae/corneas clear, PERRLA, EOMI, asymmetric right eye Neck: No adenopathy, no tenderness, radiation changes visible, asymmetric Resp: Clear to auscultation bilaterally Cardio: Regular rate and rhythm, S1, S2 normal, no murmur, click, rub or gallop GI: Soft, non-tender, slight distention, hypoactive bowel sounds, no organomegaly Extremities: Extremities normal, atraumatic, no cyanosis or edema Lymph nodes: Supraclavicular and axillary nodes are normal Neurologic: Grossly normal   Laboratory Data: No results found for this or any previous visit (from the past 48 hour(s)).   Imaging Studies: 05/12/2012 Chest xray 2 views:  Impression:  Near complete resolution of the right middle lobe consolidation  Impression/Plan: Lisa Newman is now 17 years from the time of diagnosis without evidence of recurrence. The results of the 05/12/2012 chest xray were discussed with the patient.  The patient will follow up with [place] in the future on an as needed basis, if she has concerns or any changes.    Lisa Marrow NP-C 05/19/2012, 10:50 AM

## 2012-05-20 ENCOUNTER — Encounter: Payer: Self-pay | Admitting: Oncology

## 2012-05-20 ENCOUNTER — Telehealth: Payer: Self-pay

## 2012-05-20 NOTE — Telephone Encounter (Signed)
lvm that last x-rays were reviewed by DSM and radiologist and there is no evidence of recurrent tumor.

## 2012-05-20 NOTE — Progress Notes (Signed)
This patient was seen by [place] on 05/19/2012. She requested that we review recent chest x-rays have been carried out due to a suspected pneumonia in the right middle lobe originally seen on the chest x-ray of 03/18/2012. More recent chest x-rays from 04/04/2012 and 03/18/2012 have shown resolution of this right middle lobe consolidation. I reviewed these 3 x-rays with the radiologist. There is no evidence for recurrent tumor. I will have my desk nurse call the patient with this information and reassurance.

## 2013-03-29 ENCOUNTER — Other Ambulatory Visit: Payer: Self-pay | Admitting: Physical Medicine and Rehabilitation

## 2013-03-29 DIAGNOSIS — M5136 Other intervertebral disc degeneration, lumbar region: Secondary | ICD-10-CM

## 2013-04-03 ENCOUNTER — Ambulatory Visit
Admission: RE | Admit: 2013-04-03 | Discharge: 2013-04-03 | Disposition: A | Discharge status: Home / Self Care | Payer: Medicare Other | Source: Ambulatory Visit | Attending: Physical Medicine and Rehabilitation | Admitting: Physical Medicine and Rehabilitation

## 2013-04-03 DIAGNOSIS — M5136 Other intervertebral disc degeneration, lumbar region: Secondary | ICD-10-CM

## 2013-04-07 ENCOUNTER — Other Ambulatory Visit (HOSPITAL_COMMUNITY): Payer: Self-pay | Admitting: Interventional Radiology

## 2013-04-07 DIAGNOSIS — IMO0002 Reserved for concepts with insufficient information to code with codable children: Secondary | ICD-10-CM

## 2013-04-07 DIAGNOSIS — M549 Dorsalgia, unspecified: Secondary | ICD-10-CM

## 2013-04-11 ENCOUNTER — Ambulatory Visit (HOSPITAL_COMMUNITY)
Admission: RE | Admit: 2013-04-11 | Discharge: 2013-04-11 | Disposition: A | Discharge status: Home / Self Care | Payer: Medicare Other | Source: Ambulatory Visit | Attending: Interventional Radiology | Admitting: Interventional Radiology

## 2013-04-11 DIAGNOSIS — M549 Dorsalgia, unspecified: Secondary | ICD-10-CM

## 2013-04-11 DIAGNOSIS — S22000A Wedge compression fracture of unspecified thoracic vertebra, initial encounter for closed fracture: Secondary | ICD-10-CM | POA: Insufficient documentation

## 2013-04-11 DIAGNOSIS — IMO0002 Reserved for concepts with insufficient information to code with codable children: Secondary | ICD-10-CM

## 2014-09-13 ENCOUNTER — Other Ambulatory Visit (HOSPITAL_COMMUNITY): Payer: Self-pay | Admitting: Respiratory Therapy

## 2014-09-13 DIAGNOSIS — J441 Chronic obstructive pulmonary disease with (acute) exacerbation: Secondary | ICD-10-CM

## 2014-09-17 ENCOUNTER — Encounter (HOSPITAL_COMMUNITY): Payer: Medicare Other

## 2014-10-01 ENCOUNTER — Ambulatory Visit (HOSPITAL_COMMUNITY)
Admission: RE | Admit: 2014-10-01 | Discharge: 2014-10-01 | Disposition: A | Discharge status: Home / Self Care | Payer: Medicare Other | Source: Ambulatory Visit | Attending: Internal Medicine | Admitting: Internal Medicine

## 2014-10-01 ENCOUNTER — Encounter (INDEPENDENT_AMBULATORY_CARE_PROVIDER_SITE_OTHER): Payer: Self-pay

## 2014-10-01 DIAGNOSIS — J441 Chronic obstructive pulmonary disease with (acute) exacerbation: Secondary | ICD-10-CM | POA: Insufficient documentation

## 2014-10-01 MED ORDER — ALBUTEROL SULFATE (2.5 MG/3ML) 0.083% IN NEBU
2.5000 mg | INHALATION_SOLUTION | Freq: Once | RESPIRATORY_TRACT | Status: AC
  Administered 2014-10-01: 2.5 mg via RESPIRATORY_TRACT

## 2014-10-02 LAB — PULMONARY FUNCTION TEST
DL/VA % pred: 98 %
DL/VA: 4.46 ml/min/mmHg/L
DLCO UNC % PRED: 75 %
DLCO unc: 16.31 ml/min/mmHg
FEF 25-75 POST: 1.43 L/s
FEF 25-75 Pre: 0.94 L/sec
FEF2575-%CHANGE-POST: 52 %
FEF2575-%PRED-POST: 83 %
FEF2575-%PRED-PRE: 54 %
FEV1-%Change-Post: 11 %
FEV1-%Pred-Post: 77 %
FEV1-%Pred-Pre: 68 %
FEV1-POST: 1.57 L
FEV1-Pre: 1.41 L
FEV1FVC-%CHANGE-POST: 3 %
FEV1FVC-%PRED-PRE: 93 %
FEV6-%CHANGE-POST: 9 %
FEV6-%Pred-Post: 83 %
FEV6-%Pred-Pre: 75 %
FEV6-Post: 2.15 L
FEV6-Pre: 1.95 L
FEV6FVC-%Change-Post: 1 %
FEV6FVC-%PRED-POST: 105 %
FEV6FVC-%Pred-Pre: 104 %
FVC-%CHANGE-POST: 8 %
FVC-%PRED-POST: 79 %
FVC-%PRED-PRE: 72 %
FVC-PRE: 1.98 L
FVC-Post: 2.15 L
POST FEV1/FVC RATIO: 73 %
PRE FEV1/FVC RATIO: 71 %
Post FEV6/FVC ratio: 100 %
Pre FEV6/FVC Ratio: 99 %
RV % pred: 216 %
RV: 4.58 L
TLC % PRED: 138 %
TLC: 6.59 L

## 2014-11-05 ENCOUNTER — Encounter (HOSPITAL_COMMUNITY): Payer: Self-pay | Admitting: *Deleted

## 2014-11-06 ENCOUNTER — Encounter (HOSPITAL_COMMUNITY): Payer: Self-pay | Admitting: *Deleted

## 2014-11-13 ENCOUNTER — Encounter (HOSPITAL_COMMUNITY): Payer: Self-pay | Admitting: Anesthesiology

## 2014-11-13 ENCOUNTER — Ambulatory Visit (HOSPITAL_COMMUNITY): Admission: RE | Admit: 2014-11-13 | Payer: Medicare Other | Source: Ambulatory Visit | Admitting: Gastroenterology

## 2014-11-13 HISTORY — DX: Chronic obstructive pulmonary disease, unspecified: J44.9

## 2014-11-13 HISTORY — DX: Raynaud's syndrome without gangrene: I73.00

## 2014-11-13 HISTORY — DX: Emphysema, unspecified: J43.9

## 2014-11-13 SURGERY — COLONOSCOPY WITH PROPOFOL
Anesthesia: Monitor Anesthesia Care

## 2014-11-13 NOTE — Anesthesia Preprocedure Evaluation (Deleted)
Anesthesia Evaluation  Patient identified by MRN, date of birth, ID band Patient awake    Reviewed: Allergy & Precautions, H&P , NPO status , Patient's Chart, lab work & pertinent test results  Airway Mallampati: II  TM Distance: >3 FB Neck ROM: full    Dental no notable dental hx.    Pulmonary asthma , COPD breath sounds clear to auscultation  Pulmonary exam normal       Cardiovascular hypertension, Pt. on medications Rhythm:regular Rate:Normal     Neuro/Psych Raynaud's disease negative neurological ROS  negative psych ROS   GI/Hepatic negative GI ROS, Neg liver ROS,   Endo/Other  negative endocrine ROS  Renal/GU negative Renal ROS  negative genitourinary   Musculoskeletal   Abdominal   Peds  Hematology negative hematology ROS (+)   Anesthesia Other Findings Parotid cancer  Reproductive/Obstetrics negative OB ROS                             Anesthesia Physical Anesthesia Plan  ASA: III  Anesthesia Plan: MAC   Post-op Pain Management:    Induction:   Airway Management Planned:   Additional Equipment:   Intra-op Plan:   Post-operative Plan:   Informed Consent: I have reviewed the patients History and Physical, chart, labs and discussed the procedure including the risks, benefits and alternatives for the proposed anesthesia with the patient or authorized representative who has indicated his/her understanding and acceptance.   Dental Advisory Given  Plan Discussed with: CRNA and Surgeon  Anesthesia Plan Comments:         Anesthesia Quick Evaluation

## 2014-11-15 ENCOUNTER — Other Ambulatory Visit: Payer: Self-pay | Admitting: Internal Medicine

## 2014-11-15 DIAGNOSIS — R1033 Periumbilical pain: Secondary | ICD-10-CM

## 2014-11-15 DIAGNOSIS — R52 Pain, unspecified: Secondary | ICD-10-CM

## 2014-11-16 ENCOUNTER — Ambulatory Visit
Admission: RE | Admit: 2014-11-16 | Discharge: 2014-11-16 | Disposition: A | Discharge status: Home / Self Care | Payer: Medicare Other | Source: Ambulatory Visit | Attending: Internal Medicine | Admitting: Internal Medicine

## 2014-11-16 DIAGNOSIS — R52 Pain, unspecified: Secondary | ICD-10-CM

## 2014-11-16 DIAGNOSIS — R1033 Periumbilical pain: Secondary | ICD-10-CM

## 2014-11-16 MED ORDER — IOHEXOL 300 MG/ML  SOLN
75.0000 mL | Freq: Once | INTRAMUSCULAR | Status: AC | PRN
  Administered 2014-11-16: 75 mL via INTRAVENOUS

## 2014-12-19 ENCOUNTER — Other Ambulatory Visit: Payer: Self-pay | Admitting: Gastroenterology

## 2015-01-04 ENCOUNTER — Encounter (HOSPITAL_COMMUNITY): Payer: Self-pay | Admitting: *Deleted

## 2015-01-15 ENCOUNTER — Ambulatory Visit (HOSPITAL_COMMUNITY): Payer: Medicare Other | Admitting: Anesthesiology

## 2015-01-15 ENCOUNTER — Encounter (HOSPITAL_COMMUNITY)
Admission: RE | Disposition: A | Discharge status: Home / Self Care | Payer: Self-pay | Source: Ambulatory Visit | Attending: Gastroenterology

## 2015-01-15 ENCOUNTER — Ambulatory Visit (HOSPITAL_COMMUNITY)
Admission: RE | Admit: 2015-01-15 | Discharge: 2015-01-15 | Disposition: A | Discharge status: Home / Self Care | Payer: Medicare Other | Source: Ambulatory Visit | Attending: Gastroenterology | Admitting: Gastroenterology

## 2015-01-15 ENCOUNTER — Encounter (HOSPITAL_COMMUNITY): Payer: Self-pay | Admitting: Anesthesiology

## 2015-01-15 DIAGNOSIS — I10 Essential (primary) hypertension: Secondary | ICD-10-CM | POA: Diagnosis not present

## 2015-01-15 DIAGNOSIS — Z1211 Encounter for screening for malignant neoplasm of colon: Secondary | ICD-10-CM | POA: Insufficient documentation

## 2015-01-15 DIAGNOSIS — E785 Hyperlipidemia, unspecified: Secondary | ICD-10-CM | POA: Diagnosis not present

## 2015-01-15 DIAGNOSIS — M1990 Unspecified osteoarthritis, unspecified site: Secondary | ICD-10-CM | POA: Insufficient documentation

## 2015-01-15 DIAGNOSIS — Z8701 Personal history of pneumonia (recurrent): Secondary | ICD-10-CM | POA: Insufficient documentation

## 2015-01-15 DIAGNOSIS — M810 Age-related osteoporosis without current pathological fracture: Secondary | ICD-10-CM | POA: Diagnosis not present

## 2015-01-15 DIAGNOSIS — J449 Chronic obstructive pulmonary disease, unspecified: Secondary | ICD-10-CM | POA: Diagnosis not present

## 2015-01-15 DIAGNOSIS — Z888 Allergy status to other drugs, medicaments and biological substances status: Secondary | ICD-10-CM | POA: Diagnosis not present

## 2015-01-15 DIAGNOSIS — Z7982 Long term (current) use of aspirin: Secondary | ICD-10-CM | POA: Insufficient documentation

## 2015-01-15 DIAGNOSIS — K219 Gastro-esophageal reflux disease without esophagitis: Secondary | ICD-10-CM | POA: Diagnosis not present

## 2015-01-15 HISTORY — PX: COLONOSCOPY WITH PROPOFOL: SHX5780

## 2015-01-15 HISTORY — DX: Unspecified fall, initial encounter: W19.XXXA

## 2015-01-15 HISTORY — DX: Unspecified place in unspecified non-institutional (private) residence as the place of occurrence of the external cause: Y92.009

## 2015-01-15 SURGERY — COLONOSCOPY WITH PROPOFOL
Anesthesia: Monitor Anesthesia Care

## 2015-01-15 MED ORDER — SODIUM CHLORIDE 0.9 % IV SOLN: INTRAVENOUS | Status: DC

## 2015-01-15 MED ORDER — LIDOCAINE HCL (CARDIAC) 20 MG/ML IV SOLN
INTRAVENOUS | Status: AC
  Filled 2015-01-15: qty 5

## 2015-01-15 MED ORDER — PROPOFOL 10 MG/ML IV BOLUS
INTRAVENOUS | Status: DC | PRN
  Administered 2015-01-15: 20 mg via INTRAVENOUS
  Administered 2015-01-15 (×2): 10 mg via INTRAVENOUS

## 2015-01-15 MED ORDER — PROPOFOL 10 MG/ML IV BOLUS
INTRAVENOUS | Status: AC
  Filled 2015-01-15: qty 20

## 2015-01-15 MED ORDER — PROPOFOL INFUSION 10 MG/ML OPTIME
INTRAVENOUS | Status: DC | PRN
  Administered 2015-01-15: 140 ug/kg/min via INTRAVENOUS

## 2015-01-15 MED ORDER — LACTATED RINGERS IV SOLN
INTRAVENOUS | Status: DC
  Administered 2015-01-15: 1000 mL via INTRAVENOUS

## 2015-01-15 SURGICAL SUPPLY — 21 items

## 2015-01-15 NOTE — Anesthesia Postprocedure Evaluation (Signed)
  Anesthesia Post-op Note  Patient: Lisa Newman  Procedure(s) Performed: Procedure(s) (LRB): COLONOSCOPY WITH PROPOFOL (N/A)  Patient Location: PACU  Anesthesia Type: MAC  Level of Consciousness: awake and alert   Airway and Oxygen Therapy: Patient Spontanous Breathing  Post-op Pain: mild  Post-op Assessment: Post-op Vital signs reviewed, Patient's Cardiovascular Status Stable, Respiratory Function Stable, Patent Airway and No signs of Nausea or vomiting  Last Vitals:  Filed Vitals:   01/15/15 0800  BP: 180/66  Pulse: 62  Temp:   Resp: 15    Post-op Vital Signs: stable   Complications: No apparent anesthesia complications

## 2015-01-15 NOTE — Anesthesia Preprocedure Evaluation (Signed)
Anesthesia Evaluation  Patient identified by MRN, date of birth, ID band Patient awake    Reviewed: Allergy & Precautions, NPO status , Patient's Chart, lab work & pertinent test results  Airway Mallampati: III  TM Distance: >3 FB Neck ROM: Full    Dental no notable dental hx.    Pulmonary asthma , COPD breath sounds clear to auscultation  Pulmonary exam normal       Cardiovascular hypertension, Pt. on medications Rhythm:Regular Rate:Normal     Neuro/Psych negative neurological ROS  negative psych ROS   GI/Hepatic negative GI ROS, Neg liver ROS,   Endo/Other  negative endocrine ROS  Renal/GU negative Renal ROS  negative genitourinary   Musculoskeletal negative musculoskeletal ROS (+)   Abdominal   Peds negative pediatric ROS (+)  Hematology negative hematology ROS (+)   Anesthesia Other Findings   Reproductive/Obstetrics negative OB ROS                             Anesthesia Physical Anesthesia Plan  ASA: III  Anesthesia Plan: MAC   Post-op Pain Management:    Induction:   Airway Management Planned: Simple Face Mask  Additional Equipment:   Intra-op Plan:   Post-operative Plan:   Informed Consent: I have reviewed the patients History and Physical, chart, labs and discussed the procedure including the risks, benefits and alternatives for the proposed anesthesia with the patient or authorized representative who has indicated his/her understanding and acceptance.   Dental advisory given  Plan Discussed with: CRNA  Anesthesia Plan Comments:         Anesthesia Quick Evaluation

## 2015-01-15 NOTE — Discharge Instructions (Signed)

## 2015-01-15 NOTE — Op Note (Signed)
[facility] [place], [address]   OPERATIVE PROCEDURE REPORT  PATIENT: Lisa Newman, Lisa Newman  MR#: [id number] BIRTHDATE: 01/11/1941 GENDER: female ENDOSCOPIST: Edmonia James, MD ASSISTANT:   Cletis Athens, technician & Anselmo Pickler, RN. PROCEDURE DATE: 2015-02-09 PRE-PROCEDURE PREPARATION: The patient was prepped with a gallon of Golytely the night prior to the procedure.  The patient has fasted for 4 hours prior to the procedure. Patient fasted for 4 hours prior to procedure. PRE-PROCEDURE PHYSICAL: Patient has stable vital signs.  Neck is supple.  There is no JVD, thyromegaly or LAD.  Chest clear to auscultation.  S1 and S2 regular.  Abdomen soft, non-distended, non-tender with NABS. PROCEDURE:     Colonoscopy, diagnostic ASA CLASS:     Class III INDICATIONS:     Colorectal cancet screening-average risk patient for colon cancer. MEDICATIONS:     Monitored anesthesia care  DESCRIPTION OF PROCEDURE:   After the risks, benefits, and alternatives of the procedure were thoroughly explained [including a 10% missed rate of cancer and polyps], informed consent was obtained.  Digital rectal exam was performed.  The EC-[id number] ([id number])  was introduced through the anus  and advanced to the cecum, which was identified by both the appendix and ileocecal valve , limited by No adverse events experienced.   The quality of the prep was good. . Multiple washes were done. Small lesions could be missed. The instrument was then slowly withdrawn as the colon was fully examined. Estimated blood loss is zero unless otherwise noted in this procedure report.     COLON FINDINGS: The ascending, transverse, descending, sigmoid colon, and rectum appeared unremarkable.  The entire colonic mucosa appeared healthy with a normal vascular pattern.  No masses, polyps, diverticula or AVMs were noted. The appendiceal orifice and the ICV were identified and photographed.  Retroflexed views revealed no abnormalities. The patient tolerated the procedure without immediate complications.  The scope was then withdrawn from the patient and the procedure terminated.  TIME TO CECUM:   3 minutes 00 seconds WITHDRAW TIME:  6 minutes 00 seconds  IMPRESSION:     Normal colonoscopy upto the cecum.  RECOMMENDATIONS:     1.  Continue surveillance 2.  High fiber diet with liberal fluid intake. 3.  OP follow-up is advised on a PRN basis.  REPEAT EXAM:      In 10 years  for a repeat colonoscopy.  If the patient has any abnormal GI symptoms in the interim, she have been advised to contact the office as soon as possible for further recommendations.   REFERRED DG:UYQIH Maudie Mercury, M.D.  eSigned:  Edmonia James, MD 2015/02/09 7:42 AM   CPT CODES:     K7425 Colonoscopy, flexible, proximal to splenic flexure; diagnostic, with or without collection of specimen(s) by brushing or washing, with or without colon decompression (separate procedure) ICD CODES:     Z12.11 Encounter for screening for malignant neoplasm of colon  The ICD and CPT codes recommended by this software are interpretations from the data that the clinical staff has captured with the software.  The verification of the translation of this report to the ICD and CPT codes and modifiers is the sole responsibility of the health care institution and practicing physician where this report was generated.  [employer] [place]. will not be held responsible for the validity of the ICD and CPT codes included on this report.  AMA assumes no liability for data contained or not contained herein. CPT is  a Designer, television/film set of the [employer].  PATIENT NAME:  Lisa Newman, Lisa Newman MR#: [id number]

## 2015-01-15 NOTE — Transfer of Care (Signed)
Immediate Anesthesia Transfer of Care Note  Patient: Lisa Newman  Procedure(s) Performed: Procedure(s): COLONOSCOPY WITH PROPOFOL (N/A)  Patient Location: Endoscopy Unit  Anesthesia Type:MAC  Level of Consciousness: awake  Airway & Oxygen Therapy: Patient Spontanous Breathing and Patient connected to face mask oxygen  Post-op Assessment: Report given to RN and Post -op Vital signs reviewed and stable  Post vital signs: Reviewed and stable  Last Vitals:  Filed Vitals:   01/15/15 0640  BP: 183/66  Pulse: 76  Temp: 36.6 C  Resp: 17    Complications: No apparent anesthesia complications

## 2015-01-15 NOTE — H&P (Signed)
Lisa Newman is an 74 y.o. female.   Chief Complaint: Colorectal cancer screening. HPI: Patient is here for a colonoscopy. See office notes for details.  Past Medical History  Diagnosis Date  . Asthma   . Pneumonia   . Arthritis   . Hypertension   . Hyperlipidemia   . GERD (gastroesophageal reflux disease)   . Osteoporosis   . Constipation   . Nasal congestion   . Trouble swallowing   . Carcinoma of parotid gland   . Carcinoma     parotid '96- surgery, chemothrapy, radiation -last surgery 2'96([facility])  . Raynaud disease     both hands when it gets cold  . Fall at home     01-04-15 a week ago, loss balance after tripped "soreness" generalized around hip area".  . COPD (chronic obstructive pulmonary disease)     carries Proair Inhaler-has not used  . Emphysema/COPD    Past Surgical History  Procedure Laterality Date  . Parotid biopsy    . Appendectomy    . Myomectomy    . Tonsillectomy    . Parotidectomy     Family History  Problem Relation Age of Onset  . Heart disease Brother     heart attack  . Cancer Paternal Aunt     pt unaware of what kind   Social History:  reports that she has never smoked. She has never used smokeless tobacco. She reports that she does not drink alcohol or use illicit drugs.  Allergies:  Allergies  Allergen Reactions  . Ibuprofen Itching    On bottom of feet and palms of hands.   Medications Prior to Admission  Medication Sig Dispense Refill  . aspirin 81 MG tablet Take 81 mg by mouth daily.      Marland Kitchen BENICAR 40 MG tablet Take 40 mg by mouth every morning.     . Calcium Carbonate-Vitamin D (CALTRATE 600+D PO) Take 1 tablet by mouth daily.     . Cholecalciferol (VITAMIN D PO) Take 1 tablet by mouth daily.     Marland Kitchen co-enzyme Q-10 50 MG capsule Take 100 mg by mouth daily.    Marland Kitchen diltiazem (CARDIZEM) 30 MG tablet Take 15 mg by mouth every evening.     . fexofenadine (ALLEGRA) 180 MG tablet Take 180 mg by mouth as needed for allergies.     .  fish oil-omega-3 fatty acids 1000 MG capsule Take 1 g by mouth daily.      . Multiple Vitamin (MULTIVITAMIN PO) Take 1 tablet by mouth daily.     . pravastatin (PRAVACHOL) 20 MG tablet Take 20 mg by mouth daily.      Marland Kitchen tiZANidine (ZANAFLEX) 4 MG capsule Take 4 mg by mouth at bedtime.    . vitamin C (ASCORBIC ACID) 500 MG tablet Take 500 mg by mouth daily.    Marland Kitchen albuterol (PROVENTIL HFA;VENTOLIN HFA) 108 (90 BASE) MCG/ACT inhaler Inhale 2 puffs into the lungs every 6 (six) hours as needed for wheezing or shortness of breath. Proair HHN     No results found for this or any previous visit (from the past 18 hour(s)). No results found.  Review of Systems  Constitutional: Negative.   HENT: Negative.   Eyes: Negative.   Respiratory: Negative.   Cardiovascular: Negative.   Gastrointestinal: Negative.   Genitourinary: Negative.   Musculoskeletal: Positive for joint pain.  Skin: Negative.   Neurological: Negative.   Endo/Heme/Allergies: Negative.   Psychiatric/Behavioral: Negative.     Blood pressure  183/66, pulse 76, temperature 97.8 F (36.6 C), temperature source Oral, resp. rate 17, height 5\' 1"  (1.549 m), weight 46.72 kg (103 lb), SpO2 93 %. Physical Exam  Constitutional: She is oriented to person, place, and time. She appears well-developed and well-nourished.  HENT:  Head: Normocephalic and atraumatic.  Facial asymmetry from previous parotid surgery  Eyes: Conjunctivae and EOM are normal. Pupils are equal, round, and reactive to light.  Neck: Normal range of motion. Neck supple.  Cardiovascular: Normal rate and regular rhythm.   GI: Soft. Bowel sounds are normal.  Musculoskeletal: Normal range of motion.  Neurological: She is alert and oriented to person, place, and time.  Skin: Skin is warm and dry.  Psychiatric: She has a normal mood and affect. Her behavior is normal. Judgment and thought content normal.   Assessment/Plan Colorectal cancer screening; proceed with a  colonoscopy at this time.  Fantasia Jinkins 01/15/2015, 7:14 AM

## 2015-01-16 ENCOUNTER — Encounter (HOSPITAL_COMMUNITY): Payer: Self-pay | Admitting: Gastroenterology

## 2016-05-28 ENCOUNTER — Other Ambulatory Visit: Payer: Self-pay

## 2016-07-08 ENCOUNTER — Ambulatory Visit (INDEPENDENT_AMBULATORY_CARE_PROVIDER_SITE_OTHER): Payer: Medicare Other | Admitting: Podiatry

## 2016-07-08 ENCOUNTER — Encounter: Payer: Self-pay | Admitting: Podiatry

## 2016-07-08 ENCOUNTER — Ambulatory Visit (INDEPENDENT_AMBULATORY_CARE_PROVIDER_SITE_OTHER): Payer: Medicare Other

## 2016-07-08 VITALS — BP 155/80 | HR 73 | Resp 16 | Ht 61.0 in | Wt 103.0 lb

## 2016-07-08 DIAGNOSIS — M722 Plantar fascial fibromatosis: Secondary | ICD-10-CM | POA: Diagnosis not present

## 2016-07-08 DIAGNOSIS — M79672 Pain in left foot: Secondary | ICD-10-CM

## 2016-07-08 DIAGNOSIS — M779 Enthesopathy, unspecified: Secondary | ICD-10-CM

## 2016-07-08 DIAGNOSIS — M79671 Pain in right foot: Secondary | ICD-10-CM

## 2016-07-08 MED ORDER — TRIAMCINOLONE ACETONIDE 10 MG/ML IJ SUSP
10.0000 mg | Freq: Once | INTRAMUSCULAR | Status: AC
Start: 2016-07-08 — End: 2016-07-08
  Administered 2016-07-08: 10 mg

## 2016-07-08 NOTE — Progress Notes (Signed)
Subjective:     Patient ID: Lisa Newman, female   DOB: Dec 03, 1940, 75 y.o.   MRN: [id number]  HPI patient presents stating that the right foot has pain in the heel and the left arch is also bothering her and she was concerned about the possibility for digital deformity second right. States it's been hurting for several months   Review of Systems  All other systems reviewed and are negative.      Objective:   Physical Exam  Constitutional: She is oriented to person, place, and time.  Cardiovascular: Intact distal pulses.   Musculoskeletal: Normal range of motion.  Neurological: She is oriented to person, place, and time.  Skin: Skin is warm.  Nursing note and vitals reviewed.  neurovascular status intact muscle strength adequate range of motion within normal limits with patient found to have exquisite discomfort plantar aspect right heel insertional point tendon into the calcaneus with mild arch pain left with depression of the arch and slight deformity second digit right. Patient's found have good digital perfusion and is well oriented 3     Assessment:     Plantar fasciitis right with moderate plantar fasciitis left with hammertoe deformity second right and depression of the arch noted    Plan:     H&P x-rays reviewed and today I injected the right plantar fascia 3 mg Kenalog 5 mg Xylocaine advised on physical therapy and dispensed fascial brace for the foot. Gave instructions on anti-inflammatories and reappoint to recheck  X-rays indicate small spur formation with no indications of stress fracture or arthritis

## 2016-07-08 NOTE — Patient Instructions (Signed)

## 2016-07-08 NOTE — Progress Notes (Signed)
   Subjective:    Patient ID: Lisa Newman, female    DOB: November 15, 1940, 75 y.o.   MRN: [id number]  HPI Chief Complaint  Patient presents with  . Foot Pain    Bilateral; Right foot-back of heel; Left foot-arch; pt stated, "has had pain for several months"  . Toe Pain    Left foot; 2nd toe      Review of Systems  HENT: Positive for sinus pressure.   Respiratory: Positive for shortness of breath and wheezing.   Musculoskeletal: Positive for arthralgias and back pain.  Skin: Positive for rash.  All other systems reviewed and are negative.      Objective:   Physical Exam        Assessment & Plan:

## 2018-11-01 ENCOUNTER — Telehealth: Payer: Self-pay

## 2018-11-01 NOTE — Telephone Encounter (Signed)
Spoke with pt waiting on call back for referral

## 2018-11-03 NOTE — Telephone Encounter (Signed)
Pt called asking if she is needing another ultrasound on her neck. Stated insurance will not cover it. Said she had one in January.

## 2018-11-03 NOTE — Telephone Encounter (Signed)
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 No need to repeat now, but to do it in June or July 2020.

## 2018-12-07 ENCOUNTER — Other Ambulatory Visit: Payer: Self-pay | Admitting: Cardiology

## 2019-04-14 DIAGNOSIS — I10 Essential (primary) hypertension: Secondary | ICD-10-CM | POA: Insufficient documentation

## 2019-04-14 DIAGNOSIS — I6521 Occlusion and stenosis of right carotid artery: Secondary | ICD-10-CM | POA: Insufficient documentation

## 2019-04-14 DIAGNOSIS — I351 Nonrheumatic aortic (valve) insufficiency: Secondary | ICD-10-CM | POA: Insufficient documentation

## 2019-04-14 DIAGNOSIS — E7800 Pure hypercholesterolemia, unspecified: Secondary | ICD-10-CM | POA: Insufficient documentation

## 2019-04-14 NOTE — Progress Notes (Signed)
Subjective:  Primary Physician:  [name], MD  Patient ID: Lisa Newman, female    DOB: September 20, 1941, 78 y.o.   MRN: [id number]  This visit type was conducted due to national recommendations for restrictions regarding the COVID-19 Pandemic (e.g. social distancing).  This format is felt to be most appropriate for this patient at this time.  All issues noted in this document were discussed and addressed.  No physical exam was performed (except for noted visual exam findings with Telehealth visits - very limited).  The patient has consented to conduct a Telehealth visit and understands insurance will be billed.   I connected with patient, on 04/18/19  by a telemedicine application and verified that I am speaking with the correct person using two identifiers.     I discussed the limitations of evaluation and management by telemedicine and the availability of in person appointments. The patient expressed understanding and agreed to proceed.   I have discussed with patient regarding the safety during COVID Pandemic and steps and precautions including social distancing with the patient.    Chief Complaint  Patient presents with  . Aortic Insuffiency    6 month f/u   . Hypertension  . Follow-up   HPI: Lisa Newman  is a 78 y.o. female who presents for a follow-up for Aortic regurgitation. ( Mild to Moderate by echo. on 08/10/2017), and Carotid artery disease.  Patient is feeling fair. She denies any chest pain or pressure. She has occasional heartburn from acid reflux. She has mild shortness of breath on doing house work like cleaning, vacuuming etc., unchanged from before. 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 No h/o orthopnea, PND. No swelling on legs. No palpitation, dizziness, near syncope or syncope.  She has history of hypertension and hypercholesterolemia. No history of diabetes. She does not smoke. She has h/o Raynaud's phenomenon.  Patient has  carotid artery stenosis, asymptomatic. There are no symptoms of TIA. She denies any symptoms of sudden weakness or numbness on any one side of the body, sudden difficulty in speech or vision etc.  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 She had radiation and chemotherapy subsequently. No History of recurrence of malignancy.  Patient has history of GERD.  Past Medical History:  Diagnosis Date  . Arthritis   . Asthma   . Carcinoma ([place])    parotid '96- surgery, chemothrapy, radiation -last surgery 2'96([facility])  . 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 Constipation   . COPD (chronic obstructive pulmonary disease) ([place])    carries Proair Inhaler-has not used  . Emphysema/COPD ([place])   . Fall at home    01-04-15 a week ago, loss balance after tripped "soreness" generalized around hip area".  . GERD (gastroesophageal reflux disease)   . Hyperlipidemia   . Hypertension   . Nasal congestion   . Osteoporosis   . Pneumonia   . Raynaud disease    both hands when it gets cold  . Trouble swallowing     Past Surgical History:  Procedure Laterality Date  . APPENDECTOMY    . COLONOSCOPY WITH PROPOFOL N/A 01/15/2015   Procedure: COLONOSCOPY WITH PROPOFOL;  Surgeon: Juanita Craver, MD;  Location: [facility];  Service: Endoscopy;  Laterality: N/A;  . myomectomy    . parotid biopsy    . PAROTIDECTOMY    . TONSILLECTOMY      Social History   Socioeconomic History  . Marital status:  Divorced    Spouse name: Not on file  . Number of children: 0  . Years of education: Not on file  . Highest education level: Not on file  Occupational History  . Not on file  Social Needs  . Financial resource strain: Not on file  . Food insecurity    Worry: Not on file    Inability: Not on file  . Transportation needs    Medical: Not on file    Non-medical: Not on file  Tobacco Use  . Smoking status: Never Smoker  . Smokeless tobacco: Never Used  . Tobacco comment: "second  hand smoke exposure"  Substance and Sexual Activity  . Alcohol use: No  . Drug use: No  . Sexual activity: Not on file  Lifestyle  . Physical activity    Days per week: Not on file    Minutes per session: Not on file  . Stress: Not on file  Relationships  . Social Herbalist on phone: Not on file    Gets together: Not on file    Attends religious service: Not on file    Active member of club or organization: Not on file    Attends meetings of clubs or organizations: Not on file    Relationship status: Not on file  . Intimate partner violence    Fear of current or ex partner: Not on file    Emotionally abused: Not on file    Physically abused: Not on file    Forced sexual activity: Not on file  Other Topics Concern  . Not on file  Social History Narrative  . Not on file    Current Outpatient Medications on File Prior to Visit  Medication Sig Dispense Refill  . aspirin 81 MG tablet Take 81 mg by mouth daily.      Lisa Kitchen BENICAR 40 MG tablet Take 40 mg by mouth every morning.     . Calcium Carbonate-Vitamin D (CALTRATE 600+D PO) Take 1 tablet by mouth daily.     . Cholecalciferol (VITAMIN D PO) Take 1 tablet by mouth daily.     Lisa Kitchen co-enzyme Q-10 50 MG capsule Take 100 mg by mouth daily.    Lisa Kitchen diltiazem (CARDIZEM) 30 MG tablet Take 30 mg by mouth 3 times daily.     . fexofenadine (ALLEGRA) 180 MG tablet Take 180 mg by mouth as needed for allergies.     . fish oil-omega-3 fatty acids 1000 MG capsule Take 1 g by mouth daily.      . Multiple Vitamin (MULTIVITAMIN PO) Take 1 tablet by mouth daily.     . rosuvastatin (CRESTOR) 20 MG tablet TAKE 1 TABLET DAILY 90 tablet 1  . vitamin B-12 (CYANOCOBALAMIN) 500 MCG tablet Take 500 mcg by mouth daily.    . vitamin C (ASCORBIC ACID) 500 MG tablet Take 500 mg by mouth daily.     No current facility-administered medications on file prior to visit.     Review of Systems  Constitutional: Negative for fever.  HENT: Negative for  nosebleeds.   Eyes: Negative for blurred vision.  Respiratory: Positive for shortness of breath (on exertion). Negative for cough.   Cardiovascular: Negative for chest pain, palpitations and leg swelling.  Gastrointestinal: Negative for abdominal pain, nausea and vomiting.  Genitourinary: Negative for dysuria.  Musculoskeletal: Negative for myalgias.  Skin: Negative for itching and rash.  Neurological: Negative for dizziness, seizures and loss of consciousness.  Psychiatric/Behavioral: The patient is not  nervous/anxious.      Objective:  Blood pressure 140/70, pulse 93, height 5\' 1"  (1.549 m), weight 100 lb (45.4 kg). Body mass index is 18.89 kg/m.  Physical Exam: Patient is alert and oriented, appeared very comfortable talking to me during the visit. No further detailed physical examination was possible as it was a telemedicine visit.  CARDIAC STUDIES:  Carotid artery duplex 09/29/2018: Stenosis in the right internal carotid artery (>=70%). Peak velocity in the right proximal ICA is 343/94 cm/s with ICA/CCA ratio of 5.45. The right PSV internal/common carotid artery ratio is consistent with a stenosis of >70%. Stenosis of 16-49% (lower end of spectrum) left ICA. Antegrade right vertebral artery flow. Antegrade left vertebral artery flow. Compared to 03/29/2018, no significant change. Follow up in six months is appropriate if clinically indicated. Echocardiogram 08/10/2017: Normal global wall motion. Doppler evidence of grade I (impaired) diastolic dysfunction, normal LAP. Visual EF is 55-60%. Left atrial cavity is mildly dilated by volume. Measured 3.2 cm long axis. Trileaflet aortic valve with mild to moderate (Grade II) regurgitation. Structurally normal mitral valve with mild (Grade I) regurgitation. Native tricuspid valve with moderate regurgitation. No evidence of pulmonary hypertension. Structurally normal pulmonic valve with mild regurgitation. Compared to the study done on  03/27/2015, no significant change. EKG- 10/01/2018- Sinus rhythm, rightward axis, RBBB.  Assessment & Recommendations:   1. Stenosis of right carotid artery  2. Nonrheumatic aortic valve insufficiency  3. Essential hypertension  4. Hypercholesterolemia  Laboratory Exam: 11/22/2017- glucose-87, BUN-27, creatinine-1.1, sodium-143, potassium-4.1, TSH-2.1. WBC-5.6, hemoglobin-13.3, hematocrit-40.9, platelets is 236.  Lipid Panel  11/22/2017 - cholesterol-147, HDL-106, LDL-24, triglycerides-86. Normal liver enzymes.  Recommendation:  Carotid artery disease is stable clinically. we will continue medical therapy. Patient is asymptomatic and does not have TIAs. I have explained regarding symptoms of TIAs or stroke and have advised her to call 911 & go to ER if she has any of those symptoms. She will have repeat carotid duplex in few weeks. We will consider surgery if the right internal carotid artery stenosis is greater than 80%.  Valvular heart disease is stable. No symptoms or signs of CHF and no chest pain. Blood pressure is fairly controlled, continue present medications. Continue monitoring blood pressure at home and call us if it stays high or low.  Her cholesterol is under excellent control. Continue rosuvastatin. She was again advised to follow low-salt, low-cholesterol diet. She will continue to have all the blood tests at her PCP's office.  Return for cardiac follow-up after 6 months but call us earlier if there are any problems. Patient will have echocardiogram before the next visit for follow-up of valvular heart disease. We will request blood test results from her PCP's office.  Despina Hick, MD, [facility] 04/18/2019, 9:24 AM [facility]. [place] Pager: [phone] Office: [phone] If no answer Cell [phone]

## 2019-04-17 ENCOUNTER — Encounter: Payer: Self-pay | Admitting: Cardiology

## 2019-04-18 ENCOUNTER — Ambulatory Visit (INDEPENDENT_AMBULATORY_CARE_PROVIDER_SITE_OTHER): Payer: Medicare Other | Admitting: Cardiology

## 2019-04-18 ENCOUNTER — Other Ambulatory Visit: Payer: Self-pay

## 2019-04-18 ENCOUNTER — Encounter: Payer: Self-pay | Admitting: Cardiology

## 2019-04-18 VITALS — BP 140/70 | HR 93 | Ht 61.0 in | Wt 100.0 lb

## 2019-04-18 DIAGNOSIS — E7800 Pure hypercholesterolemia, unspecified: Secondary | ICD-10-CM

## 2019-04-18 DIAGNOSIS — I6521 Occlusion and stenosis of right carotid artery: Secondary | ICD-10-CM | POA: Diagnosis not present

## 2019-04-18 DIAGNOSIS — I10 Essential (primary) hypertension: Secondary | ICD-10-CM | POA: Diagnosis not present

## 2019-04-18 DIAGNOSIS — I351 Nonrheumatic aortic (valve) insufficiency: Secondary | ICD-10-CM

## 2019-05-04 ENCOUNTER — Ambulatory Visit (INDEPENDENT_AMBULATORY_CARE_PROVIDER_SITE_OTHER): Payer: Medicare Other

## 2019-05-04 ENCOUNTER — Other Ambulatory Visit: Payer: Self-pay

## 2019-05-04 DIAGNOSIS — I6521 Occlusion and stenosis of right carotid artery: Secondary | ICD-10-CM | POA: Diagnosis not present

## 2019-05-11 ENCOUNTER — Other Ambulatory Visit: Payer: Self-pay | Admitting: General Surgery

## 2019-05-11 DIAGNOSIS — M7918 Myalgia, other site: Secondary | ICD-10-CM

## 2019-05-14 ENCOUNTER — Other Ambulatory Visit: Payer: Self-pay | Admitting: Cardiology

## 2019-05-14 DIAGNOSIS — I6523 Occlusion and stenosis of bilateral carotid arteries: Secondary | ICD-10-CM

## 2019-05-17 ENCOUNTER — Telehealth: Payer: Self-pay

## 2019-05-17 NOTE — Telephone Encounter (Signed)
Pt calling wanting to know results of the carotid study done 05/04/19. I do not see where it has been resulted and sent to clinical staff. Please advise.//ah

## 2019-05-22 ENCOUNTER — Other Ambulatory Visit: Payer: Self-pay

## 2019-05-22 ENCOUNTER — Ambulatory Visit
Admission: RE | Admit: 2019-05-22 | Discharge: 2019-05-22 | Disposition: A | Discharge status: Home / Self Care | Payer: Medicare Other | Source: Ambulatory Visit | Attending: General Surgery | Admitting: General Surgery

## 2019-05-22 DIAGNOSIS — M7918 Myalgia, other site: Secondary | ICD-10-CM

## 2019-05-22 MED ORDER — IOPAMIDOL (ISOVUE-300) INJECTION 61%
100.0000 mL | Freq: Once | INTRAVENOUS | Status: AC | PRN
  Administered 2019-05-22: 100 mL via INTRAVENOUS

## 2019-05-24 NOTE — Telephone Encounter (Signed)
Pt aware.//ah

## 2019-06-13 ENCOUNTER — Other Ambulatory Visit: Payer: Self-pay

## 2019-06-13 DIAGNOSIS — E7800 Pure hypercholesterolemia, unspecified: Secondary | ICD-10-CM

## 2019-06-13 MED ORDER — ROSUVASTATIN CALCIUM 20 MG PO TABS: 20.0000 mg | ORAL_TABLET | Freq: Every day | ORAL | 3 refills | Status: DC

## 2019-06-16 ENCOUNTER — Other Ambulatory Visit: Payer: Self-pay

## 2019-06-16 DIAGNOSIS — E7800 Pure hypercholesterolemia, unspecified: Secondary | ICD-10-CM

## 2019-06-16 MED ORDER — ROSUVASTATIN CALCIUM 20 MG PO TABS: 20.0000 mg | ORAL_TABLET | Freq: Every day | ORAL | 3 refills | Status: DC

## 2019-08-15 ENCOUNTER — Other Ambulatory Visit: Payer: Self-pay | Admitting: Internal Medicine

## 2019-08-15 DIAGNOSIS — G4489 Other headache syndrome: Secondary | ICD-10-CM

## 2019-08-28 ENCOUNTER — Ambulatory Visit
Admission: RE | Admit: 2019-08-28 | Discharge: 2019-08-28 | Disposition: A | Discharge status: Home / Self Care | Payer: Medicare Other | Source: Ambulatory Visit | Attending: Internal Medicine | Admitting: Internal Medicine

## 2019-08-28 ENCOUNTER — Other Ambulatory Visit: Payer: Self-pay

## 2019-08-28 DIAGNOSIS — G4489 Other headache syndrome: Secondary | ICD-10-CM

## 2019-10-03 ENCOUNTER — Other Ambulatory Visit: Payer: Self-pay

## 2019-10-03 ENCOUNTER — Ambulatory Visit (INDEPENDENT_AMBULATORY_CARE_PROVIDER_SITE_OTHER): Payer: Medicare Other

## 2019-10-03 DIAGNOSIS — I351 Nonrheumatic aortic (valve) insufficiency: Secondary | ICD-10-CM | POA: Diagnosis not present

## 2019-10-17 ENCOUNTER — Ambulatory Visit: Payer: Medicare Other | Admitting: Cardiology

## 2019-10-23 ENCOUNTER — Telehealth: Payer: Self-pay

## 2019-10-23 NOTE — Telephone Encounter (Signed)
Yes

## 2019-10-23 NOTE — Telephone Encounter (Signed)
Pt mention is it ok for her to get her Covid shot.

## 2019-10-24 ENCOUNTER — Ambulatory Visit: Payer: 59

## 2019-11-02 ENCOUNTER — Telehealth: Payer: Self-pay

## 2019-11-02 ENCOUNTER — Ambulatory Visit: Payer: 59

## 2019-11-02 NOTE — Telephone Encounter (Signed)
Patient began taking Zpak yesterday. She has appointment for 1st Covid 19 vaccine tomorrow, 02/05. Advised to cancel and reschedule vaccine appointment. The appointment has been cancelled. Will need to reschedule the 1st Covid 19 vaccine for the week of 11/13/19. Schedule will not be available until next week-will schedule her at that time and call her with the date and time.  Calling patient to inform her we will reschedule the 1st Covid 19 vaccine appointment next week when the schedule is available-Line busy several times. Will call back later today.  TC to patient @1 :40p Informed her we would call her next week with a new Covid 19 Vaccine appointment time and date for the week of 11/13/19-1st vaccine.

## 2019-11-02 NOTE — Telephone Encounter (Signed)
Pt called with questions about the Covid vaccine. She has an appt on 2/5 and was given an antibiotic (zpak) on 2/4 from doctor for a stuffy nose. She wants to know if this will interfere with getting the vaccine. Will have nurse call her back.   Lisa Newman

## 2019-11-03 ENCOUNTER — Ambulatory Visit: Payer: 59

## 2019-11-13 ENCOUNTER — Telehealth: Payer: Self-pay | Admitting: *Deleted

## 2019-11-13 NOTE — Telephone Encounter (Signed)
Called patient with appointment information for her 1st Covid 19 vaccine. This Saturday, 2/20 at 10:45a, arrive 30 minutes early at the [facility].

## 2019-11-14 ENCOUNTER — Ambulatory Visit: Payer: 59

## 2019-11-17 ENCOUNTER — Telehealth: Payer: Self-pay | Admitting: *Deleted

## 2019-11-17 ENCOUNTER — Ambulatory Visit: Payer: 59

## 2019-11-17 NOTE — Telephone Encounter (Signed)
Patient has questions about safety of vaccine and her anxiety. Discussed Tylenol usage and daily medications with Vaccine. Patient is nervous about the vaccine- questions answered. Patient has never had a vaccine reaction- she does well with flu vaccine. Patient reassured regarding the vaccine and her response to it.

## 2019-11-18 ENCOUNTER — Ambulatory Visit: Payer: Medicare Other | Attending: Internal Medicine

## 2019-11-18 DIAGNOSIS — Z23 Encounter for immunization: Secondary | ICD-10-CM | POA: Insufficient documentation

## 2019-11-18 NOTE — Progress Notes (Signed)
   Covid-19 Vaccination Clinic  Name:  Lisa Newman    MRN: [id number] DOB: 02/11/1941  11/18/2019  [name] was observed post Covid-19 immunization for 30 minutes based on pre-vaccination screening without incidence. She was provided with Vaccine Information Sheet and instruction to access the V-Safe system.   [name] was instructed to call 911 with any severe reactions post vaccine: Marland Kitchen Difficulty breathing  . Swelling of your face and throat  . A fast heartbeat  . A bad rash all over your body  . Dizziness and weakness    Immunizations Administered    Name Date Dose VIS Date Route   Pfizer COVID-19 Vaccine 11/18/2019 10:25 AM 0.3 mL 09/08/2019 Intramuscular   Manufacturer: [place]   Lot: [id number]   [place]: [id number]

## 2019-11-21 ENCOUNTER — Ambulatory Visit: Payer: Medicare Other | Admitting: Cardiology

## 2019-11-21 ENCOUNTER — Other Ambulatory Visit: Payer: Self-pay

## 2019-11-21 ENCOUNTER — Ambulatory Visit: Payer: Medicare Other

## 2019-11-21 VITALS — BP 152/64 | HR 82

## 2019-11-21 DIAGNOSIS — I10 Essential (primary) hypertension: Secondary | ICD-10-CM

## 2019-11-21 DIAGNOSIS — I6523 Occlusion and stenosis of bilateral carotid arteries: Secondary | ICD-10-CM

## 2019-11-21 NOTE — Progress Notes (Signed)
Patient here for testing, requested that her BP be checked. BP is elevated. Has pending appt next week. Will discuss at her upcoming appt.

## 2019-11-25 ENCOUNTER — Other Ambulatory Visit: Payer: Self-pay | Admitting: Cardiology

## 2019-11-25 DIAGNOSIS — I6523 Occlusion and stenosis of bilateral carotid arteries: Secondary | ICD-10-CM

## 2019-11-27 ENCOUNTER — Ambulatory Visit: Payer: Medicare Other | Admitting: Cardiology

## 2019-11-27 ENCOUNTER — Other Ambulatory Visit: Payer: Self-pay

## 2019-11-27 ENCOUNTER — Encounter: Payer: Self-pay | Admitting: Cardiology

## 2019-11-27 VITALS — BP 146/62 | HR 85 | Temp 98.6°F | Ht 61.0 in | Wt 96.6 lb

## 2019-11-27 DIAGNOSIS — I7300 Raynaud's syndrome without gangrene: Secondary | ICD-10-CM

## 2019-11-27 DIAGNOSIS — E7800 Pure hypercholesterolemia, unspecified: Secondary | ICD-10-CM

## 2019-11-27 DIAGNOSIS — I6523 Occlusion and stenosis of bilateral carotid arteries: Secondary | ICD-10-CM

## 2019-11-27 DIAGNOSIS — I10 Essential (primary) hypertension: Secondary | ICD-10-CM

## 2019-11-27 MED ORDER — DILTIAZEM HCL ER COATED BEADS 180 MG PO CP24: 180.0000 mg | ORAL_CAPSULE | Freq: Every day | ORAL | 2 refills | Status: DC

## 2019-11-27 NOTE — Progress Notes (Signed)
Primary Physician/Referring:  Lisa Gravel, MD  Patient ID: Lisa Newman, female    DOB: [birth year], 79 y.o.   MRN: [id number]  Chief Complaint  Patient presents with  . Hypertension  . Hyperlipidemia  . Carotid artery stenosis  . Follow-up    62mo   HPI:    Lisa Newman  is a 79 y.o. Caucasian female  who presents for a follow-up for Aortic regurgitation ( Mild to Moderate by echo), and Carotid artery disease.  Past medical history significant for Raynaud's phenomena, hypertension and hyperlipidemia.  She denies any chest pain or pressure.  She has occasional heartburn from acid reflux. She has mild shortness of breath on doing house work like cleaning, vacuuming etc., unchanged from before.  She also has occasional palpitations. 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 Arthritis   . Asthma   . Carcinoma ([place])    parotid '96- surgery, chemothrapy, radiation -last surgery 2'96([facility])  . 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 Constipation   . COPD (chronic obstructive pulmonary disease) ([place])    carries Proair Inhaler-has not used  . Emphysema/COPD ([place])   . Fall at home    01-04-15 a week ago, loss balance after tripped "soreness" generalized around hip area".  . GERD (gastroesophageal reflux disease)   . Hyperlipidemia   . Hypertension   . Nasal congestion   . Osteoporosis   . Pneumonia   . Raynaud disease    both hands when it gets cold  . Trouble swallowing    Past Surgical History:  Procedure Laterality Date  . APPENDECTOMY    . COLONOSCOPY WITH PROPOFOL N/A 01/15/2015   Procedure: COLONOSCOPY WITH PROPOFOL;  Surgeon: Juanita Craver, MD;  Location: [facility];  Service: Endoscopy;  Laterality: N/A;  . myomectomy    . parotid biopsy    . PAROTIDECTOMY    . TONSILLECTOMY     Family History  Problem Relation Age of Onset  . Heart disease Brother        heart attack  . Cancer Paternal Aunt     pt unaware of what kind    Social History   Tobacco Use  . Smoking status: Never Smoker  . Smokeless tobacco: Never Used  . Tobacco comment: "second hand smoke exposure"  Substance Use Topics  . Alcohol use: No   ROS  Review of Systems  Cardiovascular: Positive for dyspnea on exertion and palpitations (occasional). Negative for chest pain and leg swelling.  Gastrointestinal: Negative for melena.   Objective  Blood pressure (!) 146/62, pulse 85, temperature 98.6 F (37 C), height 5\' 1"  (1.549 m), weight 96 lb 9.6 oz (43.8 kg), SpO2 98 %.  Vitals with BMI 11/27/2019 11/21/2019 04/18/2019  Height 5\' 1"  - 5\' 1"   Weight 96 lbs 10 oz - 100 lbs  BMI 0000000 - 0000000  Systolic 123456 0000000 [id number]  Diastolic 62 64 70  Pulse 85 82 93     Physical Exam  Constitutional:  petite  Cardiovascular: Normal rate, regular rhythm and intact distal pulses. Exam reveals a midsystolic click. Exam reveals no gallop.  Murmur heard.  Early systolic murmur is present with a grade of 2/6 at the apex. High-pitched blowing decrescendo early diastolic murmur is present with a grade of 2/6 at the lower left sternal border. Pulses:      Carotid pulses are on the right side with bruit. No leg edema,  no JVD.  Pulmonary/Chest: Effort normal and breath sounds normal.  Abdominal: Soft. Bowel sounds are normal.   Laboratory examination:   No results for input(s): NA, K, CL, CO2, GLUCOSE, BUN, CREATININE, CALCIUM, GFRNONAA, GFRAA in the last 8760 hours. CrCl cannot be calculated (Patient's most recent lab result is older than the maximum 21 days allowed.).  CMP Latest Ref Rng & Units 02/17/2010 03/08/2009 02/28/2008  Glucose 70 - 99 mg/dL 93 78 82  BUN 6 - 23 mg/dL 26(H) 23 22  Creatinine 0.40 - 1.20 mg/dL 0.97 1.08 1.07  Sodium 135 - 145 mEq/L 140 145 142  Potassium 3.5 - 5.3 mEq/L 4.3 4.6 4.5  Chloride 96 - 112 mEq/L 105 110 108  CO2 19 - 32 mEq/L 29 26 25   Calcium 8.4 - 10.5 mg/dL 9.8 9.1 9.1  Total Protein 6.0 - 8.3  g/dL 7.1 6.2 6.4  Total Bilirubin 0.3 - 1.2 mg/dL 0.8 0.4 0.5  Alkaline Phos 39 - 117 U/L 94 75 85  AST 0 - 37 U/L 28 21 20   ALT 0 - 35 U/L 25 18 17    CBC Latest Ref Rng & Units 02/17/2010 03/08/2009 02/28/2008  WBC 3.9 - 10.3 10e3/uL 6.1 3.6(L) 4.0  Hemoglobin 11.6 - 15.9 g/dL 13.4 12.7 12.8  Hematocrit 34.8 - 46.6 % 39.4 36.5 37.6  Platelets 145 - 400 10e3/uL 224 241 266   Lipid Panel  No results found for: CHOL, TRIG, HDL, CHOLHDL, VLDL, LDLCALC, LDLDIRECT HEMOGLOBIN A1C No results found for: HGBA1C, MPG TSH No results for input(s): TSH in the last 8760 hours.  External labs:   Cholesterol, total 127.000 M 06/02/2018 HDL 84.000 06/08/2019 LDL 38.000 M 11/22/2017 Triglycerides 61.000 06/08/2019  Hemoglobin 13.900 G/ 06/02/2018 Creatinine, Serum 1.110 MG/ 06/02/2018 ALT (SGPT) 15.000 IU/ 06/02/2018  TSH 1.600 06/08/2019  11/22/2017- glucose-87, BUN-27, creatinine-1.1, sodium-143, potassium-4.1, TSH-2.1. WBC-5.6, hemoglobin-13.3, hematocrit-40.9, platelets is 236.  Lipid Panel  11/22/2017 - cholesterol-147, HDL-106, LDL-24, triglycerides-86. Normal liver enzymes. Medications and allergies   Allergies  Allergen Reactions  . Ibuprofen Itching    On bottom of feet and palms of hands.     Current Outpatient Medications  Medication Instructions  . aspirin 81 mg, Daily  . Benicar 40 mg, Oral,  Every morning - 10a  . Calcium Carbonate-Vitamin D (CALTRATE 600+D PO) 1 tablet, Oral, Daily  . Cholecalciferol (VITAMIN D PO) 1 tablet, Oral, Daily  . co-enzyme Q-10 100 mg, Oral, occas  . diltiazem (CARDIZEM CD) 180 mg, Oral, Daily  . fexofenadine (ALLEGRA) 180 mg, Oral, As needed  . fish oil-omega-3 fatty acids 1 g, Daily  . Multiple Vitamin (MULTIVITAMIN PO) 1 tablet, Oral, occas  . rosuvastatin (CRESTOR) 20 mg, Oral, Daily  . VENTOLIN HFA 108 (90 Base) MCG/ACT inhaler As needed  . vitamin B-12 (CYANOCOBALAMIN) 500 mcg, Oral, Daily  . vitamin C (ASCORBIC ACID) 500 mg, Daily  . zinc  gluconate 50 mg, Oral, Daily    Radiology:   CT scan of the head without contrast 08/28/2019: No acute infarction.  Scattered small vessel ischemic changes in the white matter.  Otherwise no other significant abnormality noted.  CT Thoracic spine 04/03/2013: Atherosclerotic type changes thoracic aorta with.  Ascending thoracic aorta measures up to 3.3 cm. Coronary artery calcifications.  Cardiac Studies:   Echocardiogram 08/10/2017: Normal global wall motion. Doppler evidence of grade I (impaired) diastolic dysfunction, normal LAP. Visual EF is 55-60%. Left atrial cavity is mildly dilated by volume. Measured 3.2 cm long axis. Trileaflet aortic valve  with mild to moderate (Grade II) regurgitation. Structurally normal mitral valve with mild (Grade I) regurgitation. Native tricuspid valve with moderate regurgitation. No evidence of pulmonary hypertension. Structurally normal pulmonic valve with mild regurgitation. Compared to the study done on 03/27/2015, no significant change.  Carotid artery duplex 11/21/2019:  Doppler velocity suggests stenosis in the right internal carotid artery  (50-69%). Stenosis in the right common carotid artery (<50%). Stenosis in  the right external carotid artery (<50%).  Doppler velocity suggests stenosis in the left common carotid artery  (<50%). Stenosis in the left external carotid artery (<50%).  Antegrade right vertebral artery flow. Antegrade left vertebral artery  flow.  No significant change since 05/04/2019. Follow up in six months is  appropriate if clinically indicated.  EKG 11/27/2019: Normal sinus rhythm heart rate of 80 bpm, normal axis.  Right bundle branch block.  No evidence of ischemia. No significant change from   EKG- 10/01/2018- Sinus rhythm, rightward axis, RBBB.  Assessment     ICD-10-CM   1. Asymptomatic bilateral carotid artery stenosis  I65.23 EKG 12-Lead  2. Essential hypertension  I10 diltiazem (CARDIZEM CD) 180 MG 24 hr  capsule  3. Hypercholesterolemia  E78.00   4. Raynaud's phenomenon without gangrene  I73.00       Meds ordered this encounter  Medications  . diltiazem (CARDIZEM CD) 180 MG 24 hr capsule    Sig: Take 1 capsule (180 mg total) by mouth daily.    Dispense:  30 capsule    Refill:  2    Discontinue plain    Medications Discontinued During This Encounter  Medication Reason  . diltiazem (CARDIZEM) 30 MG tablet Change in therapy     Recommendations:   Lisa Newman  is a 79 y.o. Caucasian female  who presents for a follow-up for Aortic regurgitation ( Mild to Moderate by echo), and Carotid artery disease.  Past medical history significant for Raynaud's phenomena, hypertension and hyperlipidemia.  Patient is essentially asymptomatic, no significant change in her physical exam, she does have a prominent right carotid bruit.  We will continue carotid artery surveillance.    Blood pressure is elevated today and she is also recorded blood pressures at home to be high, discontinue plain diltiazem 30 mg 3 times daily and switch her to diltiazem CD 180 mg daily.  She has an appointment to see her PCP in 2 weeks, if blood pressure is controlled they can prescribe her 90-day supplies.  She is aware that she can call me at any point to see me sooner if necessary otherwise I will see her back in 1 year.    With regard to aortic regurgitation, no change in murmur, I do not think she needs repeat echocardiogram.  I will see her back on an annual basis.  Adrian Prows, MD, [facility] 11/27/2019, 12:14 PM [facility]. [place] Office: [phone]

## 2019-12-13 ENCOUNTER — Ambulatory Visit: Payer: Medicare Other | Attending: Internal Medicine

## 2019-12-13 DIAGNOSIS — Z23 Encounter for immunization: Secondary | ICD-10-CM

## 2019-12-13 NOTE — Progress Notes (Signed)
   Covid-19 Vaccination Clinic  Name:  Lisa Newman    MRN: [id number] DOB: 30-May-1941  12/13/2019  Lisa Newman was observed post Covid-19 immunization for 15 minutes without incident. She was provided with Vaccine Information Sheet and instruction to access the V-Safe system.   Lisa Newman was instructed to call 911 with any severe reactions post vaccine: Marland Kitchen Difficulty breathing  . Swelling of face and throat  . A fast heartbeat  . A bad rash all over body  . Dizziness and weakness   Immunizations Administered    Name Date Dose VIS Date Route   Pfizer COVID-19 Vaccine 12/13/2019 10:31 AM 0.3 mL 09/08/2019 Intramuscular   Manufacturer: [place]   Lot: [id number]   [place]: [id number]

## 2019-12-21 DIAGNOSIS — C089 Malignant neoplasm of major salivary gland, unspecified: Secondary | ICD-10-CM | POA: Insufficient documentation

## 2019-12-23 IMAGING — CT CT PELVIS WITH CONTRAST
1 series · 15 of 32 positions shown, 19 images · IV contrast (APPLIED)
Comparison: CT abdomen/pelvis dated 11/16/2014

CLINICAL DATA: Left buttock pain/palpable abnormality, possible
tick bite

EXAM:
CT PELVIS WITH CONTRAST
TECHNIQUE: Multidetector CT imaging of the pelvis was performed using the
standard protocol following the bolus administration of intravenous
contrast.
CONTRAST:  100mL [id number] IOPAMIDOL ([id number]) INJECTION 61%

[Series 2: routine pelvis w/cm · axial · 0.65mm/px · z∈[-446,-216]mm · 15 of 52 slices shown, 19 images]
[im 4/52  soft-tissue]
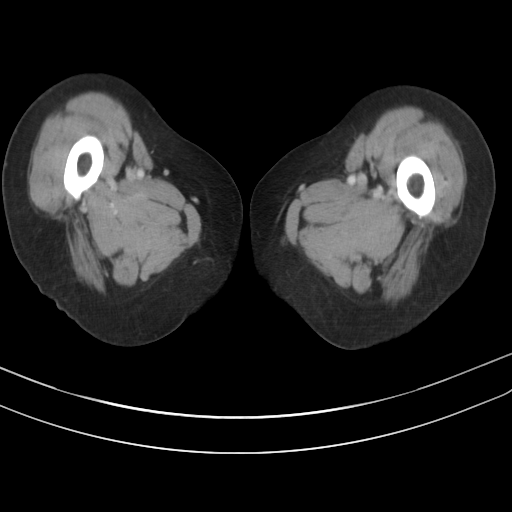
[im 4/52  bone]
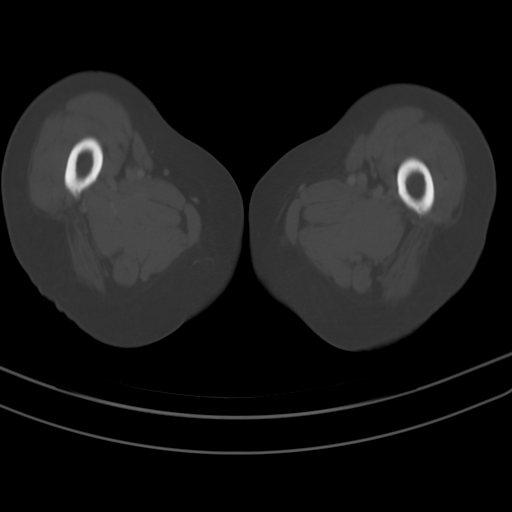
[im 7/52  soft-tissue]
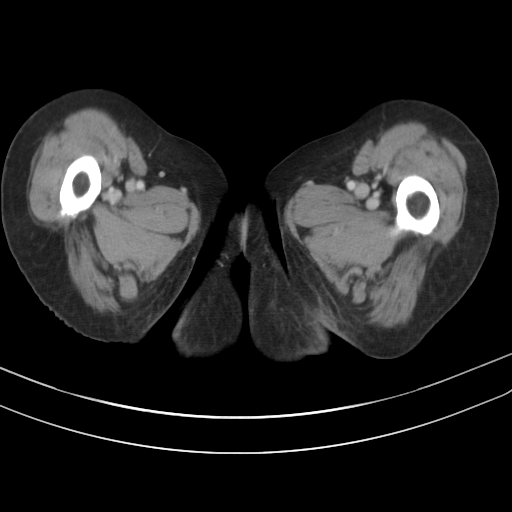
[im 10/52  soft-tissue]
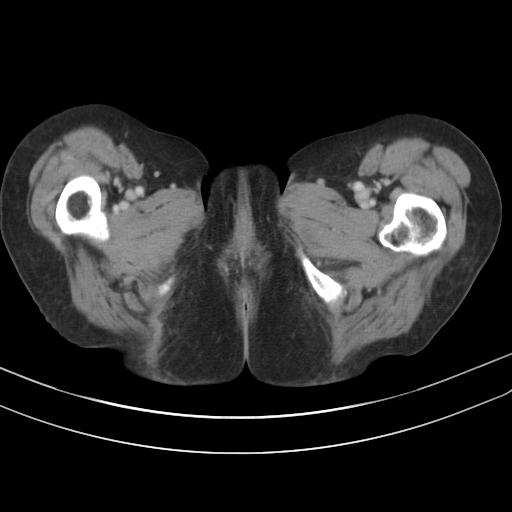
[im 15/52  soft-tissue]
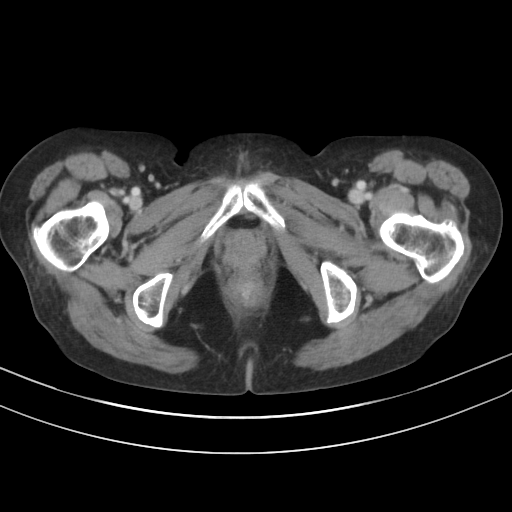
[im 19/52  soft-tissue]
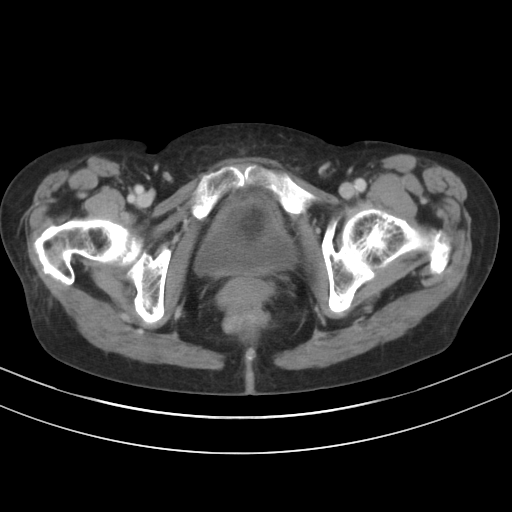
[im 22/52  soft-tissue]
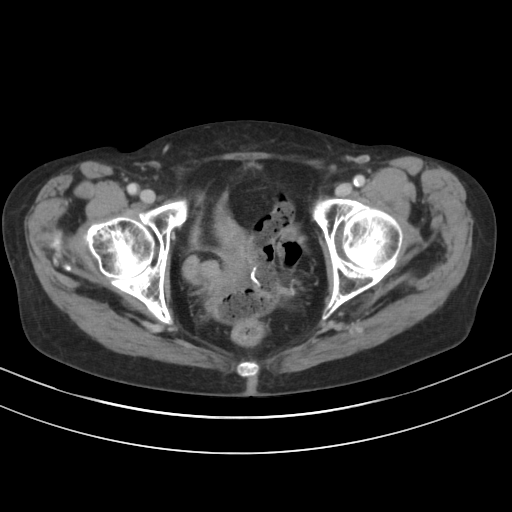
[im 27/52  soft-tissue]
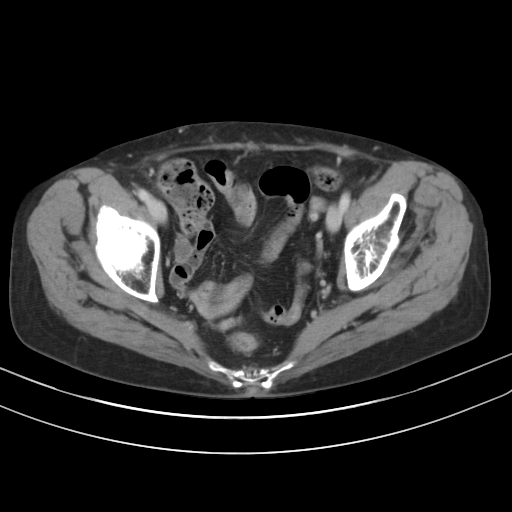
[im 30/52  soft-tissue]
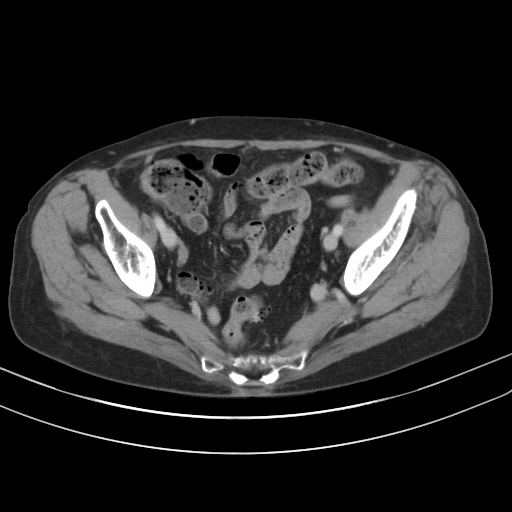
[im 33/52  soft-tissue]
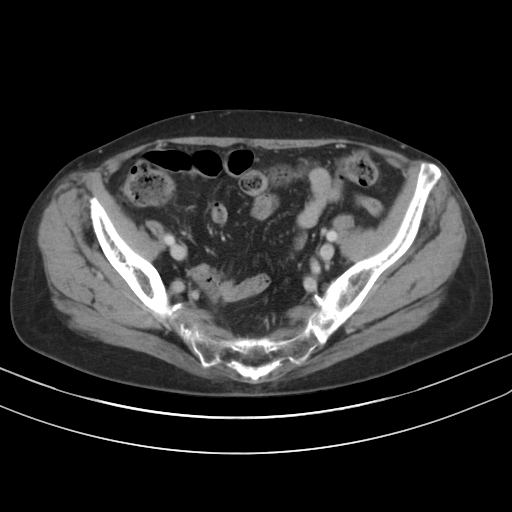
[im 33/52  bone]
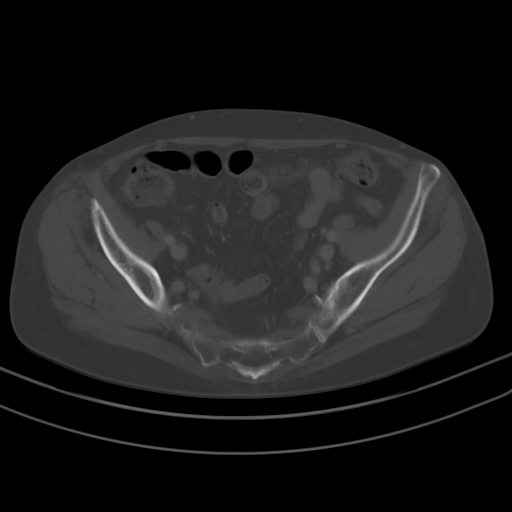
[im 37/52  soft-tissue]
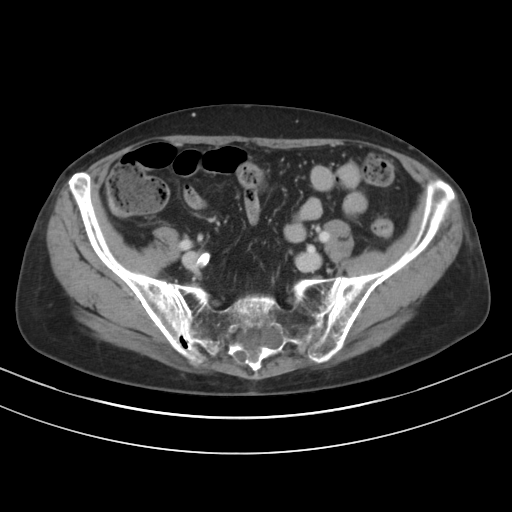
[im 42/52  soft-tissue]
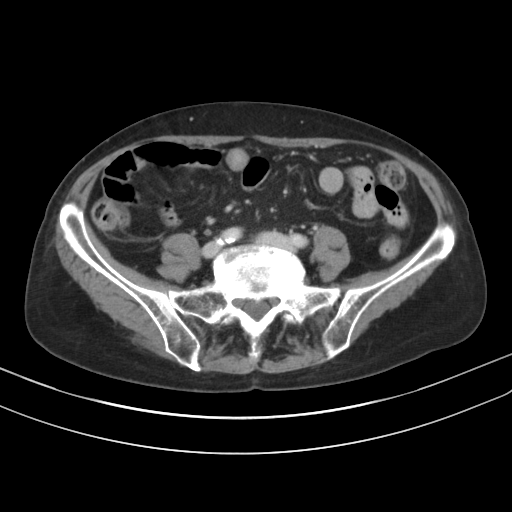
[im 45/52  soft-tissue]
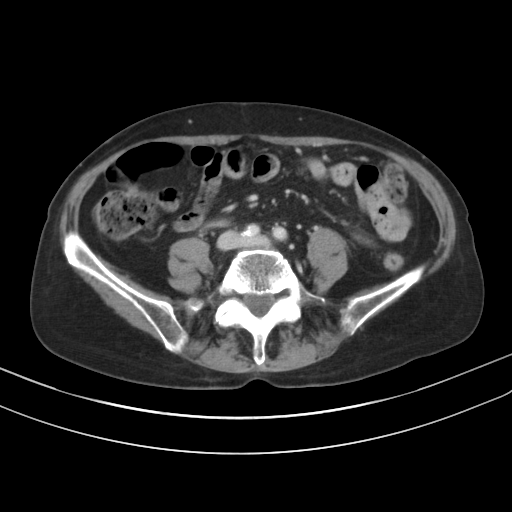
[im 45/52  lung]
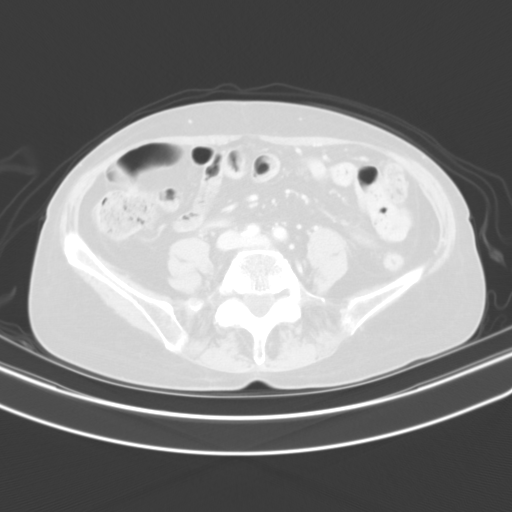
[im 47/52  lung]
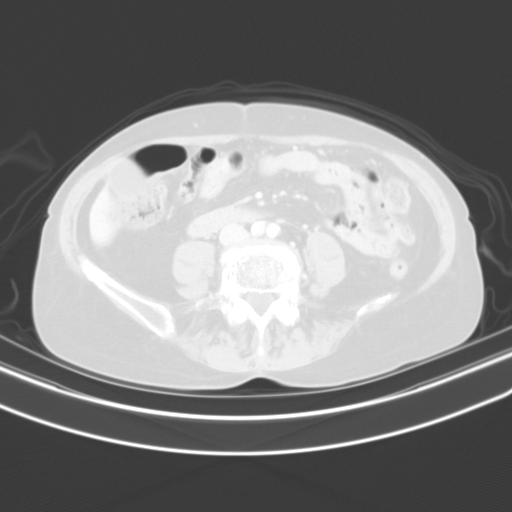
[im 48/52  soft-tissue]
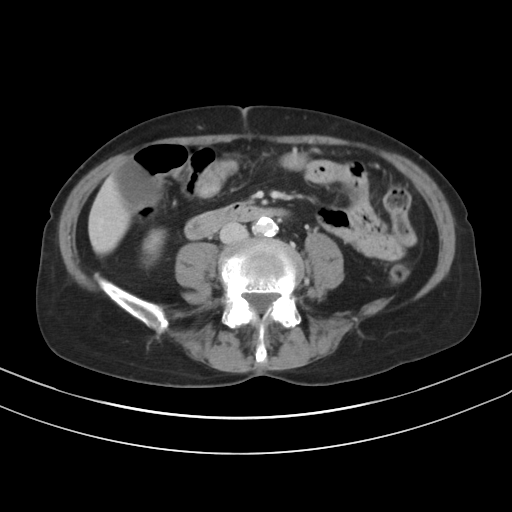
[im 48/52  lung]
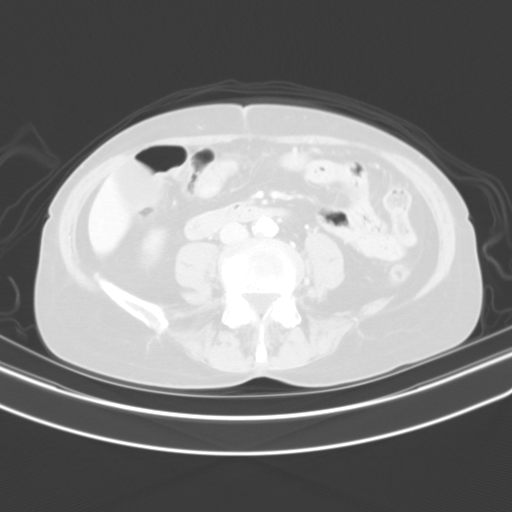
[im 50/52  lung]
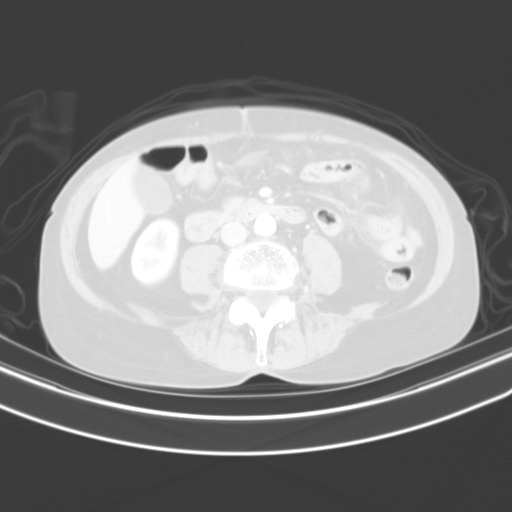

[15 of 32 positions shown; findings below may reference images not displayed]

FINDINGS: Urinary Tract:  Bladder is within normal limits.

Bowel:  Visualized bowel is unremarkable.

Vascular/Lymphatic: No evidence of aneurysm. Atherosclerotic
calcifications of the iliac vessels.

No suspicious pelvic lymphadenopathy.

Reproductive: Uterus and bilateral ovaries are within normal limits.

Other:  No pelvic ascites.

Musculoskeletal: No CT abnormality in the left gluteal region to
correspond to the palpable abnormality. Specifically, no
subcutaneous mass or abscess.

Mild degenerative changes of the lower lumbar spine.
IMPRESSION: No CT abnormality in the left gluteal region to correspond to the
palpable abnormality. Specifically, no subcutaneous mass or abscess.

Unremarkable CT pelvis.

## 2020-01-18 ENCOUNTER — Other Ambulatory Visit: Payer: Self-pay | Admitting: Cardiology

## 2020-01-18 DIAGNOSIS — I10 Essential (primary) hypertension: Secondary | ICD-10-CM

## 2020-02-13 ENCOUNTER — Other Ambulatory Visit: Payer: Self-pay | Admitting: Otolaryngology

## 2020-02-13 DIAGNOSIS — E042 Nontoxic multinodular goiter: Secondary | ICD-10-CM

## 2020-02-22 ENCOUNTER — Other Ambulatory Visit: Payer: 59

## 2020-03-21 ENCOUNTER — Other Ambulatory Visit: Payer: Self-pay | Admitting: Cardiology

## 2020-03-21 DIAGNOSIS — I10 Essential (primary) hypertension: Secondary | ICD-10-CM

## 2020-03-25 ENCOUNTER — Ambulatory Visit
Admission: RE | Admit: 2020-03-25 | Discharge: 2020-03-25 | Disposition: A | Discharge status: Home / Self Care | Payer: Medicare Other | Source: Ambulatory Visit | Attending: Otolaryngology | Admitting: Otolaryngology

## 2020-03-25 DIAGNOSIS — E042 Nontoxic multinodular goiter: Secondary | ICD-10-CM

## 2020-06-11 ENCOUNTER — Ambulatory Visit: Payer: Medicare Other

## 2020-06-11 ENCOUNTER — Other Ambulatory Visit: Payer: Self-pay

## 2020-06-11 DIAGNOSIS — I6523 Occlusion and stenosis of bilateral carotid arteries: Secondary | ICD-10-CM

## 2020-06-16 ENCOUNTER — Other Ambulatory Visit: Payer: Self-pay | Admitting: Cardiology

## 2020-06-16 DIAGNOSIS — I6523 Occlusion and stenosis of bilateral carotid arteries: Secondary | ICD-10-CM

## 2020-06-19 NOTE — Progress Notes (Signed)
Called and spoke with patient regarding her CAD results. Patient is aware to repeat in 4 months. Transferred call to 115 ([place]) to schedule repeat CAD appt.

## 2020-07-31 DIAGNOSIS — H6121 Impacted cerumen, right ear: Secondary | ICD-10-CM | POA: Insufficient documentation

## 2020-09-18 ENCOUNTER — Other Ambulatory Visit: Payer: Self-pay

## 2020-09-18 DIAGNOSIS — E7800 Pure hypercholesterolemia, unspecified: Secondary | ICD-10-CM

## 2020-09-18 MED ORDER — ROSUVASTATIN CALCIUM 20 MG PO TABS: 20.0000 mg | ORAL_TABLET | Freq: Every day | ORAL | 3 refills | Status: DC

## 2020-10-21 ENCOUNTER — Other Ambulatory Visit: Payer: Medicare Other

## 2020-10-22 ENCOUNTER — Ambulatory Visit: Payer: Medicare Other

## 2020-10-22 ENCOUNTER — Other Ambulatory Visit: Payer: Self-pay

## 2020-10-22 DIAGNOSIS — I6523 Occlusion and stenosis of bilateral carotid arteries: Secondary | ICD-10-CM

## 2020-10-27 ENCOUNTER — Other Ambulatory Visit: Payer: Self-pay | Admitting: Cardiology

## 2020-10-27 DIAGNOSIS — I6523 Occlusion and stenosis of bilateral carotid arteries: Secondary | ICD-10-CM

## 2020-10-29 NOTE — Progress Notes (Signed)
Called and spoke with patient regarding her CAD results. Patient was transferred to 115 to change appointment date.

## 2020-11-27 ENCOUNTER — Ambulatory Visit: Payer: Medicare Other | Admitting: Cardiology

## 2020-12-17 DIAGNOSIS — G510 Bell's palsy: Secondary | ICD-10-CM | POA: Insufficient documentation

## 2021-01-17 ENCOUNTER — Other Ambulatory Visit: Payer: Self-pay | Admitting: Cardiology

## 2021-01-17 DIAGNOSIS — I10 Essential (primary) hypertension: Secondary | ICD-10-CM

## 2021-03-10 ENCOUNTER — Encounter: Payer: Self-pay | Admitting: Cardiology

## 2021-03-10 ENCOUNTER — Ambulatory Visit: Payer: Medicare Other | Admitting: Cardiology

## 2021-03-10 ENCOUNTER — Other Ambulatory Visit: Payer: Self-pay

## 2021-03-10 VITALS — BP 116/62 | HR 75 | Temp 98.5°F | Ht 61.0 in | Wt 95.0 lb

## 2021-03-10 DIAGNOSIS — I6523 Occlusion and stenosis of bilateral carotid arteries: Secondary | ICD-10-CM

## 2021-03-10 DIAGNOSIS — I10 Essential (primary) hypertension: Secondary | ICD-10-CM

## 2021-03-10 MED ORDER — VENTOLIN HFA 108 (90 BASE) MCG/ACT IN AERS: 2.0000 | INHALATION_SPRAY | Freq: Four times a day (QID) | RESPIRATORY_TRACT | 0 refills | Status: DC | PRN

## 2021-03-10 NOTE — Progress Notes (Signed)
Primary Physician/Referring:  Merrilee Seashore, MD  Patient ID: [name], female    DOB: Jul 14, 1941, 80 y.o.   MRN: [id number]  Chief Complaint  Patient presents with   bilateral carotid artery stenosis   Hypertension   Follow-up   HPI:    KATRINIA STRAKER  is a 80 y.o. Caucasian female  who presents for a follow-up for Aortic regurgitation ( Mild to Moderate by echo), and Carotid artery disease.  Past medical history significant for Raynaud's phenomena, hypertension and hyperlipidemia, and asthma.   Patient presents for annual follow-up of carotid artery disease, aortic regurgitation, hyperlipidemia, and hypertension.  At last visit patient's blood pressure was uncontrolled she was therefore switched from plain diltiazem 30 mg 3 times daily to diltiazem CD1 180 mg daily.  Patient reports overall she is doing well, she remains relatively active doing housework and working in her garden without issue.  She does states she gets occasional shortness of breath while doing these activities, which is stable compared to previous.  She has also noticed occasional episodes when her blood pressure drops to approximately 517 mmHg systolic she feels significant fatigue.  Therefore patient has only been taking Benicar in the mornings when her blood pressure is elevated >140/90 mmHg.  She denies chest pain, palpitations, syncope, near syncope, dizziness.  Denies leg swelling, orthopnea, PND.   She continues to follow closely with PCP for management of other medical, database, notably she has repeat lab work pending for August.    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   GERD (gastroesophageal reflux disease)    Hyperlipidemia    Hypertension    Nasal congestion    Osteoporosis    Pneumonia    Raynaud disease    both hands when it gets cold   Trouble swallowing    Past Surgical History:  Procedure Laterality Date   APPENDECTOMY     COLONOSCOPY WITH PROPOFOL N/A 01/15/2015   Procedure: COLONOSCOPY WITH PROPOFOL;  Surgeon: Juanita Craver, MD;  Location: [facility];  Service: Endoscopy;  Laterality: N/A;   myomectomy     parotid biopsy     PAROTIDECTOMY     TONSILLECTOMY     Family History  Problem Relation Age of Onset   Heart disease Brother        heart attack   Cancer Paternal Aunt        pt unaware of what kind    Social History   Tobacco Use   Smoking status: Never   Smokeless tobacco: Never   Tobacco comments:    "second hand smoke exposure"  Substance Use Topics   Alcohol use: No   ROS  Review of Systems  Constitutional: Positive for malaise/fatigue.  Cardiovascular:  Positive for dyspnea on exertion (occasional, stable) and palpitations (occasional). Negative for chest pain, claudication, leg swelling, near-syncope, orthopnea, paroxysmal nocturnal dyspnea and syncope.  Neurological:  Negative for dizziness.  Objective  Blood pressure 116/62, pulse 75, temperature 98.5 F (36.9 C), height _0  (1.549 m), weight 95 lb (43.1 kg),  SpO2 97 %.  Vitals with BMI 03/10/2021 11/27/2019 11/21/2019  Height _0  _1  -  Weight 95 lbs 96 lbs 10 oz -  BMI 31.49 70.26 -  Systolic 378 588 502  Diastolic 62 62 64  Pulse 75 85 82     Physical Exam Constitutional:      Comments: petite  Cardiovascular:     Rate and Rhythm: Normal rate and regular rhythm.     Pulses: Intact distal pulses.          Carotid pulses are 2+ on the right side with bruit and 2+ on the left side.      Radial pulses are 2+ on the right side and 2+ on the left side.       Femoral pulses are 2+ on  the right side and 2+ on the left side.      Popliteal pulses are 2+ on the right side and 2+ on the left side.       Dorsalis pedis pulses are 2+ on the right side and 2+ on the left side.       Posterior tibial pulses are 2+ on the right side and 2+ on the left side.     Heart sounds: A midsystolic click. Murmur heard.  Early systolic murmur is present with a grade of 2/6 at the apex.  High-pitched blowing decrescendo early diastolic murmur is present with a grade of 2/4 at the lower left sternal border.    No gallop.     Comments: No leg edema, no JVD. Pulmonary:     Effort: Pulmonary effort is normal.     Breath sounds: Wheezing (bilaterally throughout) present.  Musculoskeletal:     Right lower leg: No edema.     Left lower leg: No edema.   Laboratory examination:   No results for input(s): NA, K, CL, CO2, GLUCOSE, BUN, CREATININE, CALCIUM, GFRNONAA, GFRAA in the last 8760 hours. CrCl cannot be calculated (Patient's most recent lab result is older than the maximum 21 days allowed.).  CMP Latest Ref Rng & Units 02/17/2010 03/08/2009 02/28/2008  Glucose 70 - 99 mg/dL 93 78 82  BUN 6 - 23 mg/dL 26(H) 23 22  Creatinine 0.40 - 1.20 mg/dL 0.97 1.08 1.07  Sodium 135 - 145 mEq/L 140 145 142  Potassium 3.5 - 5.3 mEq/L 4.3 4.6 4.5  Chloride 96 - 112 mEq/L 105 110 108  CO2 19 - 32 mEq/L _2 Calcium 8.4 - 10.5 mg/dL 9.8 9.1 9.1  Total Protein 6.0 - 8.3 g/dL 7.1 6.2 6.4  Total Bilirubin 0.3 - 1.2 mg/dL 0.8 0.4 0.5  Alkaline Phos 39 - 117 U/L 94 75 85  AST 0 - 37 U/L _3 ALT 0 - 35 U/L _4 CBC Latest Ref Rng & Units 02/17/2010 03/08/2009 02/28/2008  WBC 3.9 - 10.3 10e3/uL 6.1 3.6(L) 4.0  Hemoglobin 11.6 - 15.9 g/dL 13.4 12.7 12.8  Hematocrit 34.8 - 46.6 % 39.4 36.5 37.6  Platelets 145 - 400 10e3/uL 224 241 266   Lipid Panel  No results found for: CHOL, TRIG, HDL, CHOLHDL, VLDL, LDLCALC, LDLDIRECT HEMOGLOBIN A1C No results found for: HGBA1C, MPG TSH No results for  input(s): TSH in the last 8760 hours.  External labs:  01/15/2021: Glucose 95, BUN 20, creatinine 1.20, sodium 143, potassium 4.8, alk phos 99, AST 24, ALT 16, GFR 47 Total cholesterol 150, triglycerides 95, HDL 90, LDL 43 Amylase 107, lipase 40  08/12/2020: Hemoglobin 14.0, hematocrit 41.0, MCV 92, platelet 234 TSH 1.28  06/02/2018:  Creatinine, Serum 1.110 TSH 1.600  11/22/2017- glucose-87, BUN-27, creatinine-1.1, sodium-143, potassium-4.1, TSH-2.1. WBC-5.6, hemoglobin-13.3, hematocrit-40.9, platelets is 236.  Lipid Panel  11/22/2017 - cholesterol-147, HDL-106, LDL-24, triglycerides-86. Normal liver enzymes. Allergies   Allergies  Allergen Reactions   Ibuprofen Itching    On bottom of feet and palms of hands.      Medications Prior to Visit:   Outpatient Medications Prior to Visit  Medication Sig Dispense Refill   aspirin 81 MG tablet Take 81 mg by mouth daily.       BENICAR 40 MG tablet Take 20 mg by mouth every morning.     Calcium Carbonate-Vitamin D (CALTRATE 600+D PO) Take 1 tablet by mouth daily.      Cholecalciferol (VITAMIN D PO) Take 1 tablet by mouth daily.      co-enzyme Q-10 50 MG capsule Take 100 mg by mouth. occas     diltiazem (CARDIZEM CD) 180 MG 24 hr capsule TAKE 1 CAPSULE (180 MG TOTAL) BY MOUTH DAILY. PLEASE OBTAIN REFILLS FROM PCP 90 capsule 0   fexofenadine (ALLEGRA) 180 MG tablet Take 180 mg by mouth as needed for allergies.      fish oil-omega-3 fatty acids 1000 MG capsule Take 1 g by mouth daily.       Multiple Vitamin (MULTIVITAMIN PO) Take 1 tablet by mouth. occas     rosuvastatin (CRESTOR) 20 MG tablet Take 1 tablet (20 mg total) by mouth daily. 90 tablet 3   vitamin B-12 (CYANOCOBALAMIN) 500 MCG tablet Take 500 mcg by mouth daily.     vitamin C (ASCORBIC ACID) 500 MG tablet Take 500 mg by mouth daily.     zinc gluconate 50 MG tablet Take 50 mg by mouth daily.     VENTOLIN HFA 108 (90 Base) MCG/ACT inhaler as needed.     No  facility-administered medications prior to visit.   Final Medications at End of Visit    Current Meds  Medication Sig   aspirin 81 MG tablet Take 81 mg by mouth daily.     BENICAR 40 MG tablet Take 20 mg by mouth every morning.   Calcium Carbonate-Vitamin D (CALTRATE 600+D PO) Take 1 tablet by mouth daily.    Cholecalciferol (VITAMIN D PO) Take 1 tablet by mouth daily.    co-enzyme Q-10 50 MG capsule Take 100 mg by mouth. occas   diltiazem (CARDIZEM CD) 180 MG 24 hr capsule TAKE 1 CAPSULE (180 MG TOTAL) BY MOUTH DAILY. PLEASE OBTAIN REFILLS FROM PCP   fexofenadine (ALLEGRA) 180 MG tablet Take 180 mg by mouth as needed for allergies.    fish oil-omega-3 fatty acids 1000 MG capsule Take 1 g by mouth daily.     Multiple Vitamin (MULTIVITAMIN PO) Take 1 tablet by mouth. occas   rosuvastatin (CRESTOR) 20 MG tablet Take 1 tablet (20 mg total) by mouth daily.   vitamin B-12 (CYANOCOBALAMIN) 500 MCG tablet Take 500 mcg by mouth daily.   vitamin C (ASCORBIC ACID) 500 MG tablet Take 500 mg by mouth daily.   zinc gluconate 50 MG tablet Take 50 mg by mouth daily.   [DISCONTINUED] VENTOLIN HFA 108 (90 Base) MCG/ACT inhaler as needed.   Radiology:   CT scan of the head without contrast 08/28/2019: No acute infarction.  Scattered small vessel ischemic changes in the white matter.  Otherwise no other significant abnormality noted.  CT Thoracic spine 04/03/2013: Atherosclerotic type changes  thoracic aorta with.  Ascending thoracic aorta measures up to 3.3 cm. Coronary artery calcifications.  Cardiac Studies:   Carotid artery duplex 11-18-2020:  Stenosis in the right internal carotid artery (>=70%). The right PSV  internal/common carotid artery ratio of 4.74 is consistent with a stenosis  of >70%. Stenosis in the right external carotid artery (>50%).  Stenosis in the left internal carotid artery (50-69%). Stenosis in the  left external carotid artery (<50%).  Antegrade right vertebral artery flow.  Antegrade left vertebral artery  flow.  Compared to 06/11/2020, there is no significant change. Follow up in six  months is appropriate if clinically indicated.  Echocardiogram 10/03/2019:  Left ventricle cavity is normal in size. Mild concentric hypertrophy of  the left ventricle. Normal LV systolic function with EF 63%. Normal global  wall motion. Doppler evidence of grade I (impaired) diastolic dysfunction,  normal LAP.  Trileaflet aortic valve.  Moderate (Grade II) aortic regurgitation.  Moderate (Grade III) posteriorly directed mitral regurgitation.  Moderate tricuspid regurgitation. Estimated pulmonary artery systolic  pressure is 24 mmHg.  Compared to previous study on 08/10/2017, mitral regurgitation increased  in severity from mild to moderate.   EKG  03/10/2021: Sinus rhythm rate of 60 bpm.  Normal axis.  Right bundle branch block.  No evidence of underlying ischemia or injury pattern.  Compared to EKG 11/27/2019, no significant change.  11/27/2019: Normal sinus rhythm heart rate of 80 bpm, normal axis.  Right bundle branch block.  No evidence of ischemia. No significant change from   EKG- 10/01/2018- Sinus rhythm, rightward axis, RBBB.  Assessment     ICD-10-CM   1. Essential hypertension  I10 EKG 12-Lead    2. Asymptomatic bilateral carotid artery stenosis  I65.23 EKG 12-Lead      Meds ordered this encounter  Medications   VENTOLIN HFA 108 (90 Base) MCG/ACT inhaler    Sig: Inhale 2 puffs into the lungs every 6 (six) hours as needed for wheezing or shortness of breath.    Dispense:  8 g    Refill:  0    Medications Discontinued During This Encounter  Medication Reason   VENTOLIN HFA 108 (90 Base) MCG/ACT inhaler Reorder     Recommendations:   TOMEKO SCOVILLE  is a 80 y.o. Caucasian female  who presents for a follow-up for Aortic regurgitation ( Mild to Moderate by echo), and Carotid artery disease.  Past medical history significant for Raynaud's phenomena,  hypertension and hyperlipidemia.  Caucasian female  who presents for a follow-up for Aortic regurgitation ( Mild to Moderate by echo), and Carotid artery disease.  Past medical history significant for Raynaud's phenomena, hypertension and hyperlipidemia, and asthma.   Patient presents for annual follow-up of carotid artery disease, aortic regurgitation, hyperlipidemia, and hypertension.  Patient's carotid artery disease has remained stable, she is presently scheduled for repeat carotid artery duplex next month.  Continue Rosuvastatin and aspirin, as well as regular carotid artery surveillance.  In regard to aortic regurgitation, patient's physical exam remained stable and upon further review of Dr. Irven Shelling previous recommendations repeat echocardiogram is not indicated at this time.  In regard to hypertension, patient's blood pressure is soft in the office today and she reports episodes of fatigue associated with systolic blood pressures <967 mmHg on home readings.  Have therefore advised patient to reduce Benicar from 40 mg to 20 mg once daily.  Patient will continue to monitor blood pressure on a regular basis and notify our office if she continues to  have episodes of fatigue associated with hypotension.  If patient continues to have symptomatic hypotension will reduce diltiazem from 180 mg to 120 mg daily.  Personally reviewed external labs, lipids are under excellent control.  On physical exam today patient with expiratory wheezes bilaterally throughout.  Patient states she is been without her albuterol inhaler for an extended period of time as it expired and she did not have refills.  We will refill albuterol inhaler at this time.  Recommended further follow-up and management with PCP, patient verbalized understanding agreement.  Patient is otherwise stable from a cardiovascular standpoint,. Follow-up in 1 year, sooner if needed, for aortic regurgitation, carotid artery disease,  hypertension.   Alethia Berthold, PA-C 03/10/2021, 12:40 PM Office: [phone]

## 2021-03-25 ENCOUNTER — Telehealth: Payer: Self-pay

## 2021-03-25 NOTE — Telephone Encounter (Signed)
Called and spoke with patient and here are the BP readings as you requested :   June 23 : 122/63  June 24 : 123/58  June 25 : 121/55   June 26 : 140/58  June 27 : 135/55   June 28 : 133/51

## 2021-03-26 NOTE — Telephone Encounter (Signed)
Called and spoke with patient, she voiced understanding.

## 2021-03-26 NOTE — Telephone Encounter (Signed)
Please inform pt BP with well controlled. Can continue current medications.

## 2021-04-03 NOTE — Telephone Encounter (Signed)
error 

## 2021-04-20 ENCOUNTER — Other Ambulatory Visit: Payer: Self-pay | Admitting: Cardiology

## 2021-04-20 DIAGNOSIS — I10 Essential (primary) hypertension: Secondary | ICD-10-CM

## 2021-04-22 ENCOUNTER — Other Ambulatory Visit: Payer: Self-pay

## 2021-04-22 ENCOUNTER — Ambulatory Visit: Payer: Medicare Other

## 2021-04-22 DIAGNOSIS — I6523 Occlusion and stenosis of bilateral carotid arteries: Secondary | ICD-10-CM

## 2021-04-28 NOTE — Progress Notes (Signed)
No change from previous. Right 60-70 and left <50% stenosis. No surgery until stenosis 80-90%  Carotid artery duplex 04/22/2021: Duplex suggests stenosis in the right internal carotid artery (50-69%), upper limit of the spectrum. The right PSV internal/common carotid artery ratio off 4.28 is consistent with a stenosis of >70%. Duplex suggests stenosis in the right external carotid artery (<50%). Duplex suggests stenosis in the left internal carotid artery (16-49%). Duplex suggests stenosis in the left external carotid artery (<50%). Antegrade right vertebral artery flow. Antegrade left vertebral artery flow. No significant change from 10/22/2020. Follow up in six months is appropriate if clinically indicated.

## 2021-04-29 NOTE — Progress Notes (Signed)
Pt aware. Front is scheduling 6 month follow up.

## 2021-05-22 DIAGNOSIS — H9190 Unspecified hearing loss, unspecified ear: Secondary | ICD-10-CM | POA: Insufficient documentation

## 2021-06-05 DIAGNOSIS — L299 Pruritus, unspecified: Secondary | ICD-10-CM | POA: Insufficient documentation

## 2021-07-20 ENCOUNTER — Other Ambulatory Visit: Payer: Self-pay | Admitting: Cardiology

## 2021-07-20 DIAGNOSIS — I10 Essential (primary) hypertension: Secondary | ICD-10-CM

## 2021-10-13 ENCOUNTER — Telehealth: Payer: Self-pay

## 2021-10-13 DIAGNOSIS — I10 Essential (primary) hypertension: Secondary | ICD-10-CM

## 2021-10-13 MED ORDER — DILTIAZEM HCL ER COATED BEADS 180 MG PO CP24: 180.0000 mg | ORAL_CAPSULE | Freq: Every day | ORAL | 0 refills | Status: DC

## 2021-10-13 NOTE — Telephone Encounter (Signed)
Error

## 2021-10-19 ENCOUNTER — Other Ambulatory Visit: Payer: Self-pay | Admitting: Cardiology

## 2021-10-19 DIAGNOSIS — I10 Essential (primary) hypertension: Secondary | ICD-10-CM

## 2021-10-30 ENCOUNTER — Ambulatory Visit: Payer: Medicare Other | Admitting: Cardiology

## 2021-10-30 ENCOUNTER — Encounter: Payer: Self-pay | Admitting: Cardiology

## 2021-10-30 ENCOUNTER — Other Ambulatory Visit: Payer: Self-pay

## 2021-10-30 VITALS — BP 155/72 | HR 70 | Temp 98.4°F | Resp 17 | Ht 61.0 in | Wt 96.6 lb

## 2021-10-30 DIAGNOSIS — I7300 Raynaud's syndrome without gangrene: Secondary | ICD-10-CM

## 2021-10-30 DIAGNOSIS — E7800 Pure hypercholesterolemia, unspecified: Secondary | ICD-10-CM

## 2021-10-30 DIAGNOSIS — I351 Nonrheumatic aortic (valve) insufficiency: Secondary | ICD-10-CM

## 2021-10-30 DIAGNOSIS — I10 Essential (primary) hypertension: Secondary | ICD-10-CM

## 2021-10-30 DIAGNOSIS — I6523 Occlusion and stenosis of bilateral carotid arteries: Secondary | ICD-10-CM

## 2021-10-30 MED ORDER — NITROGLYCERIN 0.1 MG/HR TD PT24: 0.1000 mg | MEDICATED_PATCH | TRANSDERMAL | 3 refills | Status: DC

## 2021-10-30 NOTE — Progress Notes (Signed)
Primary Physician/Referring:  Merrilee Seashore, MD  Patient ID: Lisa Newman, female    DOB: 01-Jul-1941, 81 y.o.   MRN: [id number]  Chief Complaint  Patient presents with   Follow-up   Results   HPI:    Lisa Newman  is a 81 y.o. Caucasian female  who presents for a follow-up for Aortic regurgitation ( Mild to Moderate by echo), and Carotid artery disease.  Past medical history significant for Raynaud's phenomena, hypertension and hyperlipidemia.  She denies any chest pain or pressure.  She has occasional heartburn from acid reflux.  She has noticed her blood pressure to be slightly elevated at home.  She is tolerating all her medications well.  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   GERD (gastroesophageal reflux disease)    Hyperlipidemia    Hypertension    Nasal congestion    Osteoporosis    Pneumonia    Raynaud disease    both hands when it gets cold   Trouble swallowing    Past Surgical History:  Procedure Laterality Date   APPENDECTOMY     COLONOSCOPY WITH PROPOFOL N/A 01/15/2015   Procedure: COLONOSCOPY WITH PROPOFOL;  Surgeon: Juanita Craver, MD;  Location: [facility];  Service: Endoscopy;  Laterality: N/A;   myomectomy     parotid biopsy     PAROTIDECTOMY     TONSILLECTOMY     Family History  Problem Relation Age of Onset   Heart disease Brother        heart attack   Cancer Paternal Aunt        pt unaware of what kind    Social History   Tobacco Use   Smoking status: Never   Smokeless tobacco: Never   Tobacco comments:    "second hand smoke exposure"   Substance Use Topics   Alcohol use: No   ROS  Review of Systems  Cardiovascular:  Negative for chest pain, dyspnea on exertion and leg swelling.  Gastrointestinal:  Negative for melena.  Objective  Blood pressure (!) 155/72, pulse 70, temperature 98.4 F (36.9 C), temperature source Temporal, resp. rate 17, height 5\' 1"  (1.549 m), weight 96 lb 9.6 oz (43.8 kg).  Vitals with BMI 10/30/2021 10/30/2021 03/10/2021  Height - 5\' 1"  5\' 1"   Weight - 96 lbs 10 oz 95 lbs  BMI - 96.29 52.84  Systolic 132 440 102  Diastolic 72 725 62  Pulse 70 75 75     Physical Exam Constitutional:      Comments: petite  Cardiovascular:     Rate and Rhythm: Normal rate and regular rhythm.     Pulses: Intact distal pulses.          Carotid pulses are  on the right side with bruit.    Heart sounds: A midsystolic click. Murmur heard.  Early systolic murmur is present with a grade of 2/6 at the apex.  High-pitched blowing decrescendo early diastolic murmur is present with a grade of 2/4 at the lower left sternal border.    No gallop.  Comments: No leg edema, no JVD. Pulmonary:     Effort: Pulmonary effort is normal.     Breath sounds: Normal breath sounds.  Abdominal:     General: Bowel sounds are normal.     Palpations: Abdomen is soft.  Skin:    Capillary Refill: Capillary refill takes 2 to 3 seconds.     Comments: Acrocyanosis present     Laboratory examination:   External labs:   Cholesterol, total 150.000 M 01/15/2021 HDL 90.000 MG 01/15/2021 LDL 43.000 MG 01/15/2021 Triglycerides 95.000 MG 01/15/2021   Medications and allergies   Allergies  Allergen Reactions   Ibuprofen Itching    On bottom of feet and palms of hands.     Current Outpatient Medications  Medication Instructions   aspirin 81 mg, Daily   Benicar 20 mg, Oral, Every morning   Calcium Carbonate-Vitamin D (CALTRATE 600+D PO) 1 tablet, Oral, Daily   Cholecalciferol (VITAMIN D PO) 1 tablet, Oral, Daily   diltiazem  (CARDIZEM CD) 180 mg, Oral, Daily, Please obtain refills from PCP   fexofenadine (ALLEGRA) 180 mg, Oral, As needed   fish oil-omega-3 fatty acids 1 g, Daily   meclizine (ANTIVERT) 25 mg, Oral, 3 times daily PRN   Multiple Vitamin (MULTIVITAMIN PO) 1 tablet, Oral, occas   nitroGLYCERIN (NITRODUR - DOSED IN MG/24 HR) 0.1 mg, Transdermal, As directed, Apply one patch to each forearm daily   pantoprazole (PROTONIX) 40 mg, Oral, As needed   rosuvastatin (CRESTOR) 20 mg, Oral, Daily   VENTOLIN HFA 108 (90 Base) MCG/ACT inhaler 2 puffs, Inhalation, Every 6 hours PRN   vitamin B-12 (CYANOCOBALAMIN) 500 mcg, Oral, Daily   vitamin C (ASCORBIC ACID) 500 mg, Daily   zinc gluconate 50 mg, Oral, Daily    Radiology:   CT scan of the head without contrast 08/28/2019: No acute infarction.  Scattered small vessel ischemic changes in the white matter.  Otherwise no other significant abnormality noted.  CT Thoracic spine 04/03/2013: Atherosclerotic type changes thoracic aorta with.  Ascending thoracic aorta measures up to 3.3 cm. Coronary artery calcifications.  Cardiac Studies:   Echocardiogram 08/10/2017: Normal global wall motion. Doppler evidence of grade I (impaired) diastolic dysfunction, normal LAP. Visual EF is 55-60%. Left atrial cavity is mildly dilated by volume. Measured 3.2 cm long axis. Trileaflet aortic valve with mild to moderate (Grade II) regurgitation. Structurally normal mitral valve with mild (Grade I) regurgitation. Native tricuspid valve with moderate regurgitation. No evidence of pulmonary hypertension. Structurally normal pulmonic valve with mild regurgitation. Compared to the study done on 03/27/2015, no significant change.  Carotid artery duplex 04/22/2021: Duplex suggests stenosis in the right internal carotid artery (50-69%), upper limit of the spectrum. The right PSV internal/common carotid artery ratio off 4.28 is consistent with a stenosis of >70%. Duplex suggests  stenosis in the right external carotid artery (<50%). Duplex suggests stenosis in the left internal carotid artery (16-49%). Duplex suggests stenosis in the left external carotid artery (<50%). Antegrade right vertebral artery flow. Antegrade left vertebral artery flow. No significant change from 10/22/2020. Follow up in six months is appropriate if clinically indicated.  EKG 10/30/2021: Normal sinus rhythm at rate of 70 bpm, right bundle branch block.  No evidence of ischemia.  No significant change from 11/27/2019.    Assessment     ICD-10-CM   1. Asymptomatic bilateral carotid artery stenosis  I65.23     2. Moderate aortic regurgitation  I35.1 PCV ECHOCARDIOGRAM COMPLETE    3. Raynaud's phenomenon without gangrene  I73.00 nitroGLYCERIN (NITRODUR - DOSED IN MG/24 HR) 0.1 mg/hr patch    4. Hypercholesterolemia  E78.00     5. Primary hypertension  I10 EKG 12-Lead    nitroGLYCERIN (NITRODUR - DOSED IN MG/24 HR) 0.1 mg/hr patch       Meds ordered this encounter  Medications   nitroGLYCERIN (NITRODUR - DOSED IN MG/24 HR) 0.1 mg/hr patch    Sig: Place 1 patch (0.1 mg total) onto the skin as directed. Apply one patch to each forearm daily    Dispense:  180 patch    Refill:  3    Medications Discontinued During This Encounter  Medication Reason   Calcium Carbonate-Vitamin D 600-5 MG-MCG TABS    co-enzyme Q-10 50 MG capsule    diltiazem (TIAZAC) 180 MG 24 hr capsule     Recommendations:   Lisa Newman  is a 81 y.o. Caucasian female  who presents for a follow-up for Aortic regurgitation ( Mild to Moderate by echo), and Carotid artery disease.  Past medical history significant for Raynaud's phenomena, hypertension and hyperlipidemia.  Patient is essentially asymptomatic, except acute exacerbation of Raynaud's during winter.  Her fingers were blue this morning.  Due to elevated blood pressure also, I would like to add Nitropatch 0.1 mg to each hand daily.  Hopefully this will also  help with blood pressure control.  Carotid artery duplex needs to be scheduled for follow-up of carotid stenosis.  She has a very soft bruit compared to prominent bruit heard last year on her right carotid.  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  I will repeat echocardiogram for follow-up of moderate atrial regurgitation. With regard to hyperlipidemia, lipids are at goal.  I will see her back in 6 months for follow-up.      Adrian Prows, MD, [facility] 10/30/2021, 9:51 AM Office: [phone] Fax: [phone] Pager: [phone]

## 2021-11-13 ENCOUNTER — Other Ambulatory Visit: Payer: Self-pay

## 2021-11-13 ENCOUNTER — Ambulatory Visit: Payer: Medicare Other

## 2021-11-13 DIAGNOSIS — I6523 Occlusion and stenosis of bilateral carotid arteries: Secondary | ICD-10-CM

## 2021-11-13 DIAGNOSIS — I351 Nonrheumatic aortic (valve) insufficiency: Secondary | ICD-10-CM

## 2021-11-18 NOTE — Progress Notes (Signed)
Heart function is normal, no change in MR from 2021, continue observation.  MR= Mitral regurgitation. (leakage on the left heart valve)  Similarly TR means tricuspid regurgitation (leakage on the right heart valve). Mild MR or TR are probably normal variants unless physicians feel it is abnormal. Reassure.

## 2021-11-18 NOTE — Progress Notes (Signed)
No change in the right carotid artery stenosis of around 70% or so, unchanged from previous, will recheck in 6 months.  Orders have been placed, please make sure that her appointment to see Anderson Malta will occur after carotid artery duplex in 6 months

## 2021-11-20 NOTE — Progress Notes (Signed)
Please let patient know we can discuss alternative imagining options at next office visit.

## 2021-11-20 NOTE — Progress Notes (Signed)
Called and spoke with patient regarding her echocardiogram results.

## 2021-11-20 NOTE — Progress Notes (Signed)
I left patient know during our call.

## 2021-11-20 NOTE — Progress Notes (Signed)
Just FYI,  we could consideer CT Angio when she sees you or vascular to do this
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
## 2021-12-23 ENCOUNTER — Other Ambulatory Visit: Payer: Self-pay

## 2021-12-23 ENCOUNTER — Telehealth: Payer: Self-pay | Admitting: Student

## 2021-12-23 DIAGNOSIS — E7800 Pure hypercholesterolemia, unspecified: Secondary | ICD-10-CM

## 2021-12-23 MED ORDER — ROSUVASTATIN CALCIUM 20 MG PO TABS: 20.0000 mg | ORAL_TABLET | Freq: Every day | ORAL | 3 refills | Status: DC

## 2021-12-23 NOTE — Telephone Encounter (Signed)
Refill has been sent to pts preferred pharmacy.

## 2021-12-23 NOTE — Telephone Encounter (Signed)
Patient requesting refill for rosuvastatin 20 MG. Please send to [facility] on [address]. ?

## 2022-01-22 NOTE — Progress Notes (Signed)
External labs 01/13/2022: ?BUN 27, creatinine 1.15, potassium 4.8, sodium 144 GFR 53 ?Total cholesterol 147, HDL 100, LDL 37, triglycerides 49

## 2022-03-09 NOTE — Progress Notes (Signed)
Primary Physician/Referring:  Merrilee Seashore, MD  Patient ID: Lisa Newman, female    DOB: 1941-08-25, 81 y.o.   MRN: [id number]  Chief Complaint  Patient presents with   Carotid Stenosis   Hypertension   Follow-up    1 year   HPI:    Lisa Newman  is a 81 y.o. Caucasian female  who presents for a follow-up for Aortic regurgitation ( Mild to Moderate by echo), and Carotid artery disease.  Past medical history significant for Raynaud's phenomena, hypertension and hyperlipidemia.  Patient was last seen in the office 10/30/2021 at which time added Nitropatch DGN for Raynaud's during the winter as well as elevated blood pressure.  Also last office visit ordered repeat echocardiogram and carotid artery surveillance.  Echocardiogram revealed normal LVEF with stable mitral regurgitation.  The carotid surveillance revealed carotid stenosis around 70%, unchanged compared to previous. Patient now presents for follow up.  She is feeling well without specific complaints today.  She does note that she only takes half a tablet of Benicar (20 mg) as needed if her blood pressure is >140 mmHg as tolerated.  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   GERD (gastroesophageal reflux disease)    Hyperlipidemia    Hypertension    Nasal congestion    Osteoporosis    Pneumonia    Raynaud disease    both hands when it gets cold   Trouble swallowing    Past Surgical History:  Procedure Laterality Date   APPENDECTOMY     COLONOSCOPY WITH PROPOFOL N/A 01/15/2015   Procedure: COLONOSCOPY WITH PROPOFOL;   Surgeon: Juanita Craver, MD;  Location: [facility];  Service: Endoscopy;  Laterality: N/A;   myomectomy     parotid biopsy     PAROTIDECTOMY     TONSILLECTOMY     Family History  Problem Relation Age of Onset   Heart disease Brother        heart attack   Cancer Paternal Aunt        pt unaware of what kind    Social History   Tobacco Use   Smoking status: Never   Smokeless tobacco: Never   Tobacco comments:    "second hand smoke exposure"  Substance Use Topics   Alcohol use: No   ROS  Review of Systems  Cardiovascular:  Negative for chest pain, claudication, dyspnea on exertion, leg swelling, near-syncope, orthopnea, palpitations, paroxysmal nocturnal dyspnea and syncope.  Gastrointestinal:  Negative for melena.  Neurological:  Negative for dizziness.   Objective  Blood pressure (!) 116/57, pulse 71, temperature 97.9 F (36.6 C), temperature source Temporal, resp. rate 17, height '5\' 1"'$  (1.549 m), weight 98 lb (44.5 kg), SpO2 98 %.     03/10/2022    9:04 AM 10/30/2021    9:18 AM 10/30/2021    9:12 AM  Vitals with BMI  Height '5\' 1"'$   '5\' 1"'$   Weight 98 lbs  96 lbs 10 oz  BMI 50.09  38.18  Systolic 299 371 696  Diastolic 57 72 789  Pulse 71 70 75     Physical Exam Vitals reviewed.  Constitutional:      Comments: petite  Cardiovascular:     Rate and Rhythm: Normal rate and regular rhythm.     Pulses: Intact distal pulses.          Carotid pulses are  on the right side with bruit.    Heart sounds: A midsystolic click. Murmur heard.     Early systolic murmur is present with a grade of 2/6 at the apex.     High-pitched blowing decrescendo early diastolic murmur is present with a grade of 2/4 at the lower left sternal border.     No gallop.     Comments: No leg edema, no JVD. Pulmonary:     Effort: Pulmonary effort is normal.     Breath sounds: Normal breath sounds.  Musculoskeletal:     Right lower leg: No edema.     Left lower leg: No edema.  Skin:    Capillary  Refill: Capillary refill takes 2 to 3 seconds.     Comments: Acrocyanosis present    Laboratory examination:   External labs:  01/13/2022: BUN 27, creatinine 1.15, potassium 4.8, sodium 144 GFR 53 Total cholesterol 147, HDL 100, LDL 37, triglycerides 49  Cholesterol, total 150.000 M 01/15/2021 HDL 90.000 MG 01/15/2021 LDL 43.000 MG 01/15/2021 Triglycerides 95.000 MG 01/15/2021   Allergies   Allergies  Allergen Reactions   Ibuprofen Itching    On bottom of feet and palms of hands.    Medications Prior to Visit:   Outpatient Medications Prior to Visit  Medication Sig Dispense Refill   aspirin 81 MG tablet Take 81 mg by mouth daily.       Calcium Carbonate-Vitamin D (CALTRATE 600+D PO) Take 1 tablet by mouth daily.      Cholecalciferol (VITAMIN D PO) Take 1 tablet by mouth daily.      diltiazem (CARDIZEM CD) 180 MG 24 hr capsule TAKE 1 CAPSULE (180 MG TOTAL) BY MOUTH DAILY. PLEASE OBTAIN REFILLS FROM PCP 90 capsule 0   fexofenadine (ALLEGRA) 180 MG tablet Take 180 mg by mouth as needed for allergies.      fish oil-omega-3 fatty acids 1000 MG capsule Take 1 g by mouth daily.       meclizine (ANTIVERT) 25 MG tablet Take 25 mg by mouth 3 (three) times daily as needed.     Multiple Vitamin (MULTIVITAMIN PO) Take 1 tablet by mouth. occas     Multiple Vitamins-Minerals (A[place]) TBEF Take 1 tablet by mouth as needed.     nitroGLYCERIN (NITRODUR - DOSED IN MG/24 HR) 0.1 mg/hr patch Place 1 patch (0.1 mg total) onto the skin as directed. Apply one patch to each forearm daily 180 patch 3   pantoprazole (PROTONIX) 40 MG tablet Take 40 mg by mouth as needed.     rosuvastatin (CRESTOR) 20 MG tablet Take 1 tablet (20 mg total) by mouth daily. 90 tablet 3   VENTOLIN HFA 108 (90 Base) MCG/ACT inhaler Inhale 2 puffs into the lungs every 6 (six) hours as needed for wheezing or shortness of breath. 8 g 0   BENICAR 40 MG tablet Take 20 mg by mouth every morning.     olmesartan (BENICAR) 40 MG tablet  Take 20 mg by mouth daily.     Coenzyme Q10 (CO Q-10) 100 MG CAPS Take 1 tablet by mouth See admin instructions.     Ensure (ENSURE) See admin instructions.  vitamin B-12 (CYANOCOBALAMIN) 500 MCG tablet Take 500 mcg by mouth daily.     vitamin C (ASCORBIC ACID) 500 MG tablet Take 500 mg by mouth daily.     zinc gluconate 50 MG tablet Take 50 mg by mouth daily.     No facility-administered medications prior to visit.   Final Medications at End of Visit    Current Meds  Medication Sig   aspirin 81 MG tablet Take 81 mg by mouth daily.     Calcium Carbonate-Vitamin D (CALTRATE 600+D PO) Take 1 tablet by mouth daily.    Cholecalciferol (VITAMIN D PO) Take 1 tablet by mouth daily.    diltiazem (CARDIZEM CD) 180 MG 24 hr capsule TAKE 1 CAPSULE (180 MG TOTAL) BY MOUTH DAILY. PLEASE OBTAIN REFILLS FROM PCP   fexofenadine (ALLEGRA) 180 MG tablet Take 180 mg by mouth as needed for allergies.    fish oil-omega-3 fatty acids 1000 MG capsule Take 1 g by mouth daily.     meclizine (ANTIVERT) 25 MG tablet Take 25 mg by mouth 3 (three) times daily as needed.   Multiple Vitamin (MULTIVITAMIN PO) Take 1 tablet by mouth. occas   Multiple Vitamins-Minerals (AIR[place]ORNE) TBEF Take 1 tablet by mouth as needed.   nitroGLYCERIN (NITRODUR - DOSED IN MG/24 HR) 0.1 mg/hr patch Place 1 patch (0.1 mg total) onto the skin as directed. Apply one patch to each forearm daily   olmesartan (BENICAR) 5 MG tablet Take 1 tablet (5 mg total) by mouth daily.   pantoprazole (PROTONIX) 40 MG tablet Take 40 mg by mouth as needed.   rosuvastatin (CRESTOR) 20 MG tablet Take 1 tablet (20 mg total) by mouth daily.   VENTOLIN HFA 108 (90 Base) MCG/ACT inhaler Inhale 2 puffs into the lungs every 6 (six) hours as needed for wheezing or shortness of breath.   [DISCONTINUED] BENICAR 40 MG tablet Take 20 mg by mouth every morning.   [DISCONTINUED] olmesartan (BENICAR) 40 MG tablet Take 20 mg by mouth daily.   Radiology:   CT scan of the  head without contrast 08/28/2019: No acute infarction.  Scattered small vessel ischemic changes in the white matter.  Otherwise no other significant abnormality noted.  CT Thoracic spine 04/03/2013: Atherosclerotic type changes thoracic aorta with.  Ascending thoracic aorta measures up to 3.3 cm. Coronary artery calcifications.  Cardiac Studies:   Echocardiogram 11/13/2021:  Left ventricle cavity is normal in size and wall thickness. Normal global  wall motion. Normal LV systolic function with visual EF 55-60%. Normal  diastolic filling pattern.  Structurally normal trileaflet aortic valve.  Moderate (Grade II) aortic  regurgitation.  Moderate (Grade II) mitral regurgitation.  Moderate tricuspid regurgitation. Estimated pulmonary artery systolic  pressure 31 mmHg. Mild pulmonic regurgitation.  No significant change compared to previous study in 2021.  Carotid artery duplex 11/13/2021:  The right PSV internal/common carotid artery ratio of 4.9  is consistent  with a stenosis of >70%. Duplex suggests stenosis in the right internal  carotid artery (>=70%).  Duplex suggests stenosis in the left internal carotid artery (1-15%).  Heterogeneous plaque bilateral carotid arteries.  Antegrade right vertebral artery flow. Antegrade left vertebral artery flow.  No significant change from 04/22/2021. Follow up in six months is  appropriate if clinically indicated.   EKG  03/10/2022: Sinus rhythm at a rate of 66 bpm.  Right bundle branch block.  Normal axis.  Unchanged compared to EKG 10/30/2021.  Assessment     ICD-10-CM   1. Asymptomatic bilateral  carotid artery stenosis  I65.23 EKG 12-Lead    CT ANGIO NECK W OR WO CONTRAST    Basic metabolic panel    2. Moderate aortic regurgitation  I35.1     3. Raynaud's phenomenon without gangrene  I73.00     4. Primary hypertension  I10        Meds ordered this encounter  Medications   olmesartan (BENICAR) 5 MG tablet    Sig: Take 1 tablet (5 mg  total) by mouth daily.    Dispense:  90 tablet    Refill:  3    Medications Discontinued During This Encounter  Medication Reason   vitamin B-12 (CYANOCOBALAMIN) 500 MCG tablet Patient Preference   vitamin C (ASCORBIC ACID) 500 MG tablet Patient Preference   zinc gluconate 50 MG tablet Patient Preference   olmesartan (BENICAR) 40 MG tablet Patient Preference   BENICAR 40 MG tablet Dose change    Recommendations:   Lisa Newman  is a 81 y.o. Caucasian female  who presents for a follow-up for Aortic regurgitation ( Mild to Moderate by echo), and Carotid artery disease.  Past medical history significant for Raynaud's phenomena, hypertension and hyperlipidemia.  Patient was last seen in the office 10/30/2021 at which time added Nitropatch DGN for Raynaud's during the winter Will is elevated blood pressure.  Also last office visit ordered repeat echocardiogram and carotid artery surveillance.  Echocardiogram revealed normal LVEF with stable mitral regurgitation.  The carotid surveillance revealed carotid stenosis around 70%, unchanged compared to previous. Patient now presents for follow up.  Given soft blood pressure and that patient is only taking Benicar as needed will reduce olmesartan from 20 mg to 5 mg p.o. daily.  Advised her to continue nitroglycerin patches as needed.  Reviewed and discussed results of echocardiogram and carotid duplex, details above.  We will obtain CTA of the neck to further evaluate right carotid stenosis.  Follow-up in 6 months, sooner if needed.   Alethia Berthold, PA-C 03/10/2022, 10:03 AM Office: [phone]

## 2022-03-10 ENCOUNTER — Ambulatory Visit: Payer: Medicare Other | Admitting: Student

## 2022-03-10 ENCOUNTER — Encounter: Payer: Self-pay | Admitting: Student

## 2022-03-10 VITALS — BP 116/57 | HR 71 | Temp 97.9°F | Resp 17 | Ht 61.0 in | Wt 98.0 lb

## 2022-03-10 DIAGNOSIS — I351 Nonrheumatic aortic (valve) insufficiency: Secondary | ICD-10-CM

## 2022-03-10 DIAGNOSIS — I7300 Raynaud's syndrome without gangrene: Secondary | ICD-10-CM

## 2022-03-10 DIAGNOSIS — I6523 Occlusion and stenosis of bilateral carotid arteries: Secondary | ICD-10-CM

## 2022-03-10 DIAGNOSIS — I10 Essential (primary) hypertension: Secondary | ICD-10-CM

## 2022-03-10 MED ORDER — OLMESARTAN MEDOXOMIL 5 MG PO TABS: 5.0000 mg | ORAL_TABLET | Freq: Every day | ORAL | 3 refills | Status: DC

## 2022-04-02 ENCOUNTER — Ambulatory Visit
Admission: RE | Admit: 2022-04-02 | Discharge: 2022-04-02 | Disposition: A | Discharge status: Home / Self Care | Payer: Medicare Other | Source: Ambulatory Visit | Attending: Student | Admitting: Student

## 2022-04-02 DIAGNOSIS — I6523 Occlusion and stenosis of bilateral carotid arteries: Secondary | ICD-10-CM

## 2022-04-02 MED ORDER — IOPAMIDOL (ISOVUE-370) INJECTION 76%
75.0000 mL | Freq: Once | INTRAVENOUS | Status: AC | PRN
  Administered 2022-04-02: 75 mL via INTRAVENOUS

## 2022-04-08 NOTE — Progress Notes (Signed)
Called patient, NA, LMAM

## 2022-04-08 NOTE — Progress Notes (Signed)
Patient called back, I have discussed results with her.

## 2022-04-21 ENCOUNTER — Other Ambulatory Visit: Payer: Self-pay | Admitting: Cardiology

## 2022-04-21 DIAGNOSIS — E7800 Pure hypercholesterolemia, unspecified: Secondary | ICD-10-CM

## 2022-07-13 ENCOUNTER — Telehealth: Payer: Self-pay

## 2022-07-13 NOTE — Telephone Encounter (Signed)
Patient called  and states that she was having some irregular heart beats and wanted to make an appointment to come in to see someone.

## 2022-07-14 ENCOUNTER — Ambulatory Visit: Payer: Medicare Other

## 2022-07-14 ENCOUNTER — Other Ambulatory Visit: Payer: Medicare Other

## 2022-07-14 VITALS — BP 162/62 | HR 72 | Temp 98.1°F | Resp 16 | Ht 61.0 in | Wt 97.0 lb

## 2022-07-14 DIAGNOSIS — I6523 Occlusion and stenosis of bilateral carotid arteries: Secondary | ICD-10-CM

## 2022-07-14 DIAGNOSIS — I351 Nonrheumatic aortic (valve) insufficiency: Secondary | ICD-10-CM

## 2022-07-14 DIAGNOSIS — R002 Palpitations: Secondary | ICD-10-CM

## 2022-07-14 MED ORDER — OLMESARTAN MEDOXOMIL 5 MG PO TABS
5.0000 mg | ORAL_TABLET | Freq: Every day | ORAL | 3 refills | Status: DC
Start: 2022-07-14 — End: 2022-08-13

## 2022-07-14 NOTE — Progress Notes (Signed)
Primary Physician/Referring:  Merrilee Seashore, MD  Patient ID: Lisa Newman, female    DOB: 1941-03-09, 81 y.o.   MRN: [id number]  Chief Complaint  Patient presents with   Palpitations   HPI:    Lisa Newman  is a 81 y.o. Caucasian female with history of for aortic regurgitation ( Mild to Moderate by echo), carotid artery disease raynaud's phenomena, hypertension and hyperlipidemia.  She presents today with complaints of palpitations for the past 4 weeks. She does monitor her heart rate at home and has a stethoscope and she listens to her heart daily and has noticed that her heart rhythm is irregular. She does not feel the palpitations. She does not smoke or drink alcohol and does not have excess caffeine intake. She is active and does yard work including mowing her lawn. Her home blood pressure readings are 130s/70s. She does endorse shortness of breath that is chronic and unchanged. She denies chest pain, leg edema, orthopnea, PND,  dizziness, lightheadedness.  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   GERD (gastroesophageal reflux disease)    Hyperlipidemia    Hypertension    Nasal congestion    Osteoporosis    Pneumonia    Raynaud disease    both hands when it gets cold   Trouble swallowing    Past Surgical History:  Procedure Laterality Date   APPENDECTOMY     COLONOSCOPY WITH PROPOFOL N/A 01/15/2015   Procedure: COLONOSCOPY WITH PROPOFOL;  Surgeon: Juanita Craver, MD;  Location: [facility];  Service: Endoscopy;  Laterality: N/A;   myomectomy     parotid biopsy      PAROTIDECTOMY     TONSILLECTOMY     Family History  Problem Relation Age of Onset   Heart disease Brother        heart attack   Cancer Paternal Aunt        pt unaware of what kind    Social History   Tobacco Use   Smoking status: Never   Smokeless tobacco: Never   Tobacco comments:    "second hand smoke exposure"  Substance Use Topics   Alcohol use: No   ROS  Review of Systems  Cardiovascular:  Positive for dyspnea on exertion (chronic, stable) and palpitations. Negative for chest pain, claudication, leg swelling, near-syncope, orthopnea, paroxysmal nocturnal dyspnea and syncope.  Gastrointestinal:  Negative for melena.  Neurological:  Negative for dizziness and light-headedness.   Objective  Blood pressure (!) 162/62, pulse 72, temperature 98.1 F (36.7 C), temperature source Temporal, resp. rate 16, height '5\' 1"'$  (1.549 m), weight 97 lb (44 kg), SpO2 96 %.     07/14/2022    8:36 AM 03/10/2022    9:04 AM 10/30/2021    9:18 AM  Vitals with BMI  Height '5\' 1"'$  '5\' 1"'$    Weight 97 lbs 98 lbs   BMI 62.83 66.29   Systolic 476 546 503  Diastolic 62 57 72  Pulse 72 71 70     Physical Exam Vitals reviewed.  Cardiovascular:  Rate and Rhythm: Normal rate. Rhythm irregular.     Pulses: Intact distal pulses.          Carotid pulses are  on the right side with bruit.    Heart sounds: A midsystolic click. Murmur heard.     Early systolic murmur is present with a grade of 2/6 at the apex.     High-pitched blowing decrescendo early diastolic murmur is present with a grade of 2/4 at the lower left sternal border.     No gallop.     Comments: No leg edema, no JVD. Pulmonary:     Effort: Pulmonary effort is normal.     Breath sounds: Normal breath sounds. No wheezing or rales.  Musculoskeletal:     Right lower leg: No edema.     Left lower leg: No edema.  Skin:    Capillary Refill: Capillary refill takes 2 to 3 seconds.     Comments: Acrocyanosis present    Laboratory  examination:   External labs:  05/26/2022: Cholesterol 135, HDL 86, LDL 34, triglycerides 73 TSH 0.92 Hemoglobin 13.3, hematocrit 41.2, platelet count 284 Potassium 4.9, sodium 142, glucose 94, BUN 26, creatinine 1.20, GFR 43   Allergies   Allergies  Allergen Reactions   Ibuprofen Itching    On bottom of feet and palms of hands.    Medications Prior to Visit:   Outpatient Medications Prior to Visit  Medication Sig Dispense Refill   aspirin 81 MG tablet Take 81 mg by mouth daily.       Calcium Carbonate-Vitamin D (CALTRATE 600+D PO) Take 1 tablet by mouth daily.      Cholecalciferol (VITAMIN D PO) Take 1 tablet by mouth daily.      diltiazem (CARDIZEM CD) 180 MG 24 hr capsule TAKE 1 CAPSULE (180 MG TOTAL) BY MOUTH DAILY. PLEASE OBTAIN REFILLS FROM PCP 90 capsule 0   Ensure (ENSURE) See admin instructions.     fexofenadine (ALLEGRA) 180 MG tablet Take 180 mg by mouth as needed for allergies.      fish oil-omega-3 fatty acids 1000 MG capsule Take 1 g by mouth daily.       Multiple Vitamin (MULTIVITAMIN PO) Take 1 tablet by mouth. occas     Multiple Vitamins-Minerals (AIRBORNE) TBEF Take 1 tablet by mouth as needed.     pantoprazole (PROTONIX) 40 MG tablet Take 40 mg by mouth as needed.     rosuvastatin (CRESTOR) 20 MG tablet TAKE 1 TABLET(20 MG) BY MOUTH DAILY 30 tablet 3   VENTOLIN HFA 108 (90 Base) MCG/ACT inhaler Inhale 2 puffs into the lungs every 6 (six) hours as needed for wheezing or shortness of breath. 8 g 0   olmesartan (BENICAR) 5 MG tablet Take 1 tablet (5 mg total) by mouth daily. 90 tablet 3   Coenzyme Q10 (CO Q-10) 100 MG CAPS Take 1 tablet by mouth See admin instructions. (Patient not taking: Reported on 07/14/2022)     nitroGLYCERIN (NITRODUR - DOSED IN MG/24 HR) 0.1 mg/hr patch Place 1 patch (0.1 mg total) onto the skin as directed. Apply one patch to each forearm daily (Patient not taking: Reported on 07/14/2022) 180 patch 3   meclizine (ANTIVERT) 25 MG tablet Take  25 mg by mouth 3 (three) times daily as needed.     No facility-administered medications prior to visit.   Final Medications at End of Visit    Current Meds  Medication Sig   aspirin 81 MG tablet Take 81 mg by  mouth daily.     Calcium Carbonate-Vitamin D (CALTRATE 600+D PO) Take 1 tablet by mouth daily.    Cholecalciferol (VITAMIN D PO) Take 1 tablet by mouth daily.    diltiazem (CARDIZEM CD) 180 MG 24 hr capsule TAKE 1 CAPSULE (180 MG TOTAL) BY MOUTH DAILY. PLEASE OBTAIN REFILLS FROM PCP   Ensure (ENSURE) See admin instructions.   fexofenadine (ALLEGRA) 180 MG tablet Take 180 mg by mouth as needed for allergies.    fish oil-omega-3 fatty acids 1000 MG capsule Take 1 g by mouth daily.     Multiple Vitamin (MULTIVITAMIN PO) Take 1 tablet by mouth. occas   Multiple Vitamins-Minerals (AIRBORNE) TBEF Take 1 tablet by mouth as needed.   pantoprazole (PROTONIX) 40 MG tablet Take 40 mg by mouth as needed.   rosuvastatin (CRESTOR) 20 MG tablet TAKE 1 TABLET(20 MG) BY MOUTH DAILY   VENTOLIN HFA 108 (90 Base) MCG/ACT inhaler Inhale 2 puffs into the lungs every 6 (six) hours as needed for wheezing or shortness of breath.   [DISCONTINUED] olmesartan (BENICAR) 5 MG tablet Take 1 tablet (5 mg total) by mouth daily.   Radiology:   CT scan of the head without contrast 08/28/2019: No acute infarction.  Scattered small vessel ischemic changes in the white matter.  Otherwise no other significant abnormality noted.  CT Thoracic spine 04/03/2013: Atherosclerotic type changes thoracic aorta with.  Ascending thoracic aorta measures up to 3.3 cm. Coronary artery calcifications.  Cardiac Studies:   Echocardiogram 11/13/2021:  Left ventricle cavity is normal in size and wall thickness. Normal global  wall motion. Normal LV systolic function with visual EF 55-60%. Normal  diastolic filling pattern.  Structurally normal trileaflet aortic valve.  Moderate (Grade II) aortic  regurgitation.  Moderate (Grade  II) mitral regurgitation.  Moderate tricuspid regurgitation. Estimated pulmonary artery systolic  pressure 31 mmHg. Mild pulmonic regurgitation.  No significant change compared to previous study in 2021.  Carotid artery duplex 11/13/2021:  The right PSV internal/common carotid artery ratio of 4.9  is consistent  with a stenosis of >70%. Duplex suggests stenosis in the right internal  carotid artery (>=70%).  Duplex suggests stenosis in the left internal carotid artery (1-15%).  Heterogeneous plaque bilateral carotid arteries.  Antegrade right vertebral artery flow. Antegrade left vertebral artery flow.  No significant change from 04/22/2021. Follow up in six months is  appropriate if clinically indicated.   EKG   EKG 07/14/2022: Sinus arrhythmia at 71 bpm.  Normal axis.  Right bundle branch block.  Compared to previous EKG on 03/10/2022, no significant change.  Assessment     ICD-10-CM   1. Palpitations  R00.2 EKG 12-Lead    LONG TERM MONITOR (3-14 DAYS)    2. Asymptomatic bilateral carotid artery stenosis  I65.23 PCV CAROTID DUPLEX (BILATERAL)    CANCELED: PCV CAROTID DUPLEX (BILATERAL)    3. Moderate aortic regurgitation  I35.1        Meds ordered this encounter  Medications   olmesartan (BENICAR) 5 MG tablet    Sig: Take 1 tablet (5 mg total) by mouth daily.    Dispense:  90 tablet    Refill:  3    Order Specific Question:   Supervising Provider    Answer:   Adrian Prows [[id number]]    Medications Discontinued During This Encounter  Medication Reason   meclizine (ANTIVERT) 25 MG tablet    olmesartan (BENICAR) 5 MG tablet      Recommendations:   Lisa Newman  is  a 80 y.o. Caucasian female  who presents for a follow-up for Aortic regurgitation ( Mild to Moderate by echo), and Carotid artery disease.  Past medical history significant for Raynaud's phenomena, hypertension and hyperlipidemia.  Palpitations Will place long term cardiac event monitor to evaluate for  arrhythmias or ectopic burden. Reviewed recent labs, TSH normal.  Her blood pressure was elevated at today's visit however, she reports normal readings at home and does have history of soft blood pressure. Will not make changes to medications at this time. Advised patient to keep log of home blood pressure readings and bring to next office visit. Encouraged adequate oral hydration.   Asymptomatic bilateral carotid artery stenosis Reviewed previous carotid artery duplex CTA neck. No significant change from previous studies. Will repeat carotid duplex in 6 months.  Moderate aortic regurgitation Reviewed previous echocardiogram which revealed moderate MR, TR, and AR. Will repeat echo in 1 year.  Follow-up in 2 weeks, sooner if needed.   Ernst Spell, AGNP-C 07/14/2022, 9:33 AM Office: [phone]

## 2022-07-28 ENCOUNTER — Other Ambulatory Visit: Payer: Self-pay | Admitting: Family Medicine

## 2022-07-28 DIAGNOSIS — G8929 Other chronic pain: Secondary | ICD-10-CM

## 2022-08-11 ENCOUNTER — Telehealth: Payer: Self-pay

## 2022-08-11 NOTE — Telephone Encounter (Signed)
Patient called requesting her cardiac monitor results. Please advise.

## 2022-08-12 ENCOUNTER — Other Ambulatory Visit: Payer: Self-pay

## 2022-08-12 DIAGNOSIS — I10 Essential (primary) hypertension: Secondary | ICD-10-CM

## 2022-08-13 ENCOUNTER — Other Ambulatory Visit: Payer: Medicare Other

## 2022-08-13 ENCOUNTER — Ambulatory Visit: Payer: Medicare Other

## 2022-08-13 VITALS — BP 166/69 | HR 78 | Ht 61.0 in | Wt 94.6 lb

## 2022-08-13 DIAGNOSIS — I10 Essential (primary) hypertension: Secondary | ICD-10-CM

## 2022-08-13 DIAGNOSIS — R002 Palpitations: Secondary | ICD-10-CM

## 2022-08-13 MED ORDER — OLMESARTAN MEDOXOMIL 20 MG PO TABS: 20.0000 mg | ORAL_TABLET | Freq: Every day | ORAL | 3 refills | Status: AC

## 2022-08-13 NOTE — Progress Notes (Signed)
Primary Physician/Referring:  Merrilee Seashore, MD  Patient ID: Lisa Newman, female    DOB: Sep 02, 1941, 81 y.o.   MRN: [id number]  Chief Complaint  Patient presents with   Follow-up    Bp Check   Hypertension   HPI:    Lisa Newman  is a 81 y.o. Caucasian female with history of for aortic regurgitation ( Mild to Moderate by echo), carotid artery disease raynaud's phenomena, hypertension and hyperlipidemia.  She presents today for an acute office visit for her elevated blood pressure.  She does monitor her blood pressure at home both with an automatic and manual blood pressure monitor and readings are 150s/80s.  She does occasionally have mild anemia on exertion that has not progressively worsened or changed.  She does endorse some recent life stressors including back pain and health issues with her sister. She denies chest pain, palpitations, leg edema, orthopnea, PND, syncope.  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   GERD (gastroesophageal reflux disease)    Hyperlipidemia    Hypertension    Nasal congestion    Osteoporosis    Pneumonia    Raynaud disease    both hands when it gets cold   Trouble swallowing    Past Surgical History:  Procedure Laterality Date   APPENDECTOMY     COLONOSCOPY WITH PROPOFOL N/A 01/15/2015   Procedure: COLONOSCOPY WITH PROPOFOL;  Surgeon: Juanita Craver, MD;  Location: [facility];  Service: Endoscopy;  Laterality: N/A;   myomectomy     parotid biopsy     PAROTIDECTOMY     TONSILLECTOMY     Family History  Problem Relation Age of Onset    Heart disease Brother        heart attack   Cancer Paternal Aunt        pt unaware of what kind    Social History   Tobacco Use   Smoking status: Never   Smokeless tobacco: Never   Tobacco comments:    "second hand smoke exposure"  Substance Use Topics   Alcohol use: No   ROS  Review of Systems  Cardiovascular:  Positive for dyspnea on exertion (chronic, stable). Negative for chest pain, claudication, leg swelling, near-syncope, orthopnea, palpitations, paroxysmal nocturnal dyspnea and syncope.  Gastrointestinal:  Negative for melena.  Neurological:  Negative for dizziness and light-headedness.   Objective  Blood pressure (!) 166/69, pulse 78, height '5\' 1"'$  (1.549 m), weight 94 lb 9.6 oz (42.9 kg).     08/13/2022    9:32 AM 08/13/2022    9:25 AM 07/14/2022    8:36 AM  Vitals with BMI  Height '5\' 1"'$   '5\' 1"'$   Weight 94 lbs 10 oz  97 lbs  BMI 07.37  10.62  Systolic 694 854 627  Diastolic 69 70 62  Pulse 78 76 72     Physical Exam Vitals reviewed.  Cardiovascular:     Rate and Rhythm: Normal rate and regular rhythm.     Pulses: Intact distal pulses.  Carotid pulses are  on the right side with bruit.    Heart sounds: A midsystolic click. Murmur heard.     Early systolic murmur is present with a grade of 2/6 at the apex.     High-pitched blowing decrescendo early diastolic murmur is present with a grade of 2/4 at the lower left sternal border.     No gallop.  Pulmonary:     Effort: Pulmonary effort is normal.     Breath sounds: Normal breath sounds. No wheezing or rales.  Musculoskeletal:     Right lower leg: No edema.     Left lower leg: No edema.    Laboratory examination:   External labs:  05/26/2022: Cholesterol 135, HDL 86, LDL 34, triglycerides 73 TSH 0.92 Hemoglobin 13.3, hematocrit 41.2, platelet count 284 Potassium 4.9, sodium 142, glucose 94, BUN 26, creatinine 1.20, GFR 43   Allergies   Allergies  Allergen Reactions   Ibuprofen Itching     On bottom of feet and palms of hands.    Medications Prior to Visit:   Outpatient Medications Prior to Visit  Medication Sig Dispense Refill   aspirin 81 MG tablet Take 81 mg by mouth daily.       Calcium Carbonate-Vitamin D (CALTRATE 600+D PO) Take 1 tablet by mouth daily.      Cholecalciferol (VITAMIN D PO) Take 1 tablet by mouth daily.      Coenzyme Q10 (CO Q-10) 100 MG CAPS Take 1 tablet by mouth See admin instructions.     diltiazem (CARDIZEM CD) 180 MG 24 hr capsule TAKE 1 CAPSULE (180 MG TOTAL) BY MOUTH DAILY. PLEASE OBTAIN REFILLS FROM PCP 90 capsule 0   Ensure (ENSURE) See admin instructions.     fexofenadine (ALLEGRA) 180 MG tablet Take 180 mg by mouth as needed for allergies.      fish oil-omega-3 fatty acids 1000 MG capsule Take 1 g by mouth daily.       methocarbamol (ROBAXIN) 500 MG tablet Take 250 mg by mouth 2 (two) times daily as needed.     Multiple Vitamin (MULTIVITAMIN PO) Take 1 tablet by mouth. occas     Multiple Vitamins-Minerals (AIRBORNE) TBEF Take 1 tablet by mouth as needed.     nitroGLYCERIN (NITRODUR - DOSED IN MG/24 HR) 0.1 mg/hr patch Place 1 patch (0.1 mg total) onto the skin as directed. Apply one patch to each forearm daily 180 patch 3   pantoprazole (PROTONIX) 40 MG tablet Take 40 mg by mouth as needed.     rosuvastatin (CRESTOR) 20 MG tablet TAKE 1 TABLET(20 MG) BY MOUTH DAILY 30 tablet 3   VENTOLIN HFA 108 (90 Base) MCG/ACT inhaler Inhale 2 puffs into the lungs every 6 (six) hours as needed for wheezing or shortness of breath. 8 g 0   olmesartan (BENICAR) 5 MG tablet Take 1 tablet (5 mg total) by mouth daily. 90 tablet 3   No facility-administered medications prior to visit.   Final Medications at End of Visit    Current Meds  Medication Sig   aspirin 81 MG tablet Take 81 mg by mouth daily.     Calcium Carbonate-Vitamin D (CALTRATE 600+D PO) Take 1 tablet by mouth daily.    Cholecalciferol (VITAMIN D PO) Take 1 tablet by mouth daily.    Coenzyme  Q10 (CO Q-10) 100 MG CAPS Take 1 tablet by mouth See admin instructions.   diltiazem (CARDIZEM CD) 180 MG 24 hr capsule TAKE 1 CAPSULE (180 MG TOTAL) BY  MOUTH DAILY. PLEASE OBTAIN REFILLS FROM PCP   Ensure (ENSURE) See admin instructions.   fexofenadine (ALLEGRA) 180 MG tablet Take 180 mg by mouth as needed for allergies.    fish oil-omega-3 fatty acids 1000 MG capsule Take 1 g by mouth daily.     methocarbamol (ROBAXIN) 500 MG tablet Take 250 mg by mouth 2 (two) times daily as needed.   Multiple Vitamin (MULTIVITAMIN PO) Take 1 tablet by mouth. occas   Multiple Vitamins-Minerals (AIRBORNE) TBEF Take 1 tablet by mouth as needed.   nitroGLYCERIN (NITRODUR - DOSED IN MG/24 HR) 0.1 mg/hr patch Place 1 patch (0.1 mg total) onto the skin as directed. Apply one patch to each forearm daily   pantoprazole (PROTONIX) 40 MG tablet Take 40 mg by mouth as needed.   rosuvastatin (CRESTOR) 20 MG tablet TAKE 1 TABLET(20 MG) BY MOUTH DAILY   VENTOLIN HFA 108 (90 Base) MCG/ACT inhaler Inhale 2 puffs into the lungs every 6 (six) hours as needed for wheezing or shortness of breath.   [DISCONTINUED] olmesartan (BENICAR) 5 MG tablet Take 1 tablet (5 mg total) by mouth daily.   Radiology:   CT scan of the head without contrast 08/28/2019: No acute infarction.  Scattered small vessel ischemic changes in the white matter.  Otherwise no other significant abnormality noted.  CT Thoracic spine 04/03/2013: Atherosclerotic type changes thoracic aorta with.  Ascending thoracic aorta measures up to 3.3 cm. Coronary artery calcifications.  Cardiac Studies:   Echocardiogram 11/13/2021:  Left ventricle cavity is normal in size and wall thickness. Normal global  wall motion. Normal LV systolic function with visual EF 55-60%. Normal  diastolic filling pattern.  Structurally normal trileaflet aortic valve.  Moderate (Grade II) aortic  regurgitation.  Moderate (Grade II) mitral regurgitation.  Moderate tricuspid  regurgitation. Estimated pulmonary artery systolic  pressure 31 mmHg. Mild pulmonic regurgitation.  No significant change compared to previous study in 2021.  Carotid artery duplex 11/13/2021:  The right PSV internal/common carotid artery ratio of 4.9  is consistent  with a stenosis of >70%. Duplex suggests stenosis in the right internal  carotid artery (>=70%).  Duplex suggests stenosis in the left internal carotid artery (1-15%).  Heterogeneous plaque bilateral carotid arteries.  Antegrade right vertebral artery flow. Antegrade left vertebral artery flow.  No significant change from 04/22/2021. Follow up in six months is  appropriate if clinically indicated.   EKG   EKG 07/14/2022: Sinus arrhythmia at 71 bpm.  Normal axis.  Right bundle branch block.  Compared to previous EKG on 03/10/2022, no significant change.  Assessment     ICD-10-CM   1. Primary hypertension  I10     2. Palpitations  R00.2         Meds ordered this encounter  Medications   olmesartan (BENICAR) 20 MG tablet    Sig: Take 1 tablet (20 mg total) by mouth daily.    Dispense:  90 tablet    Refill:  3    Order Specific Question:   Supervising Provider    Answer:   Adrian Prows [[id number]]    Medications Discontinued During This Encounter  Medication Reason   olmesartan (BENICAR) 5 MG tablet Reorder      Recommendations:   Lisa Newman  is a 81 y.o. Caucasian female  who presents for a follow-up for Aortic regurgitation ( Mild to Moderate by echo), and Carotid artery disease.  Past medical history significant for Raynaud's phenomena, hypertension and hyperlipidemia.  Primary hypertension She did  bring her home blood pressure monitor in today and readings correlate with office blood pressure monitor. We will increase olmesartan to 20 mg daily. Advised patient to continue to monitor home blood pressure and keep a log of readings. She will notify office if blood pressure remains >140/80.  Discussed the  importance of low-sodium diet less than 2000 mg daily.  Palpitations Reviewed the results of long-term event monitor with patient.  This did not show any significant ectopic burden or advanced arrhythmias. She is not currently having palpitations.  She does report that when checking her manual blood pressure she can hear her heart skip a beat but is asymptomatic. Continue diltiazem 180 mg daily.  Follow-up at next scheduled visit or sooner if needed.   Lisa Newman, AGNP-C 08/13/2022, 10:17 AM Office: [phone]

## 2022-09-09 ENCOUNTER — Encounter: Payer: Self-pay | Admitting: Cardiology

## 2022-09-09 ENCOUNTER — Ambulatory Visit: Payer: Medicare Other | Admitting: Cardiology

## 2022-09-09 VITALS — BP 130/68 | HR 76 | Resp 18 | Ht 61.0 in | Wt 95.6 lb

## 2022-09-09 DIAGNOSIS — I351 Nonrheumatic aortic (valve) insufficiency: Secondary | ICD-10-CM

## 2022-09-09 DIAGNOSIS — I7300 Raynaud's syndrome without gangrene: Secondary | ICD-10-CM

## 2022-09-09 DIAGNOSIS — I6523 Occlusion and stenosis of bilateral carotid arteries: Secondary | ICD-10-CM

## 2022-09-09 DIAGNOSIS — I10 Essential (primary) hypertension: Secondary | ICD-10-CM

## 2022-09-09 NOTE — Progress Notes (Signed)
Primary Physician/Referring:  Merrilee Seashore, MD  Patient ID: Lisa Newman, female    DOB: 1941/02/01, 81 y.o.   MRN: [id number]  Chief Complaint  Patient presents with   Valve disease   Hypertension   Hyperlipidemia   Carotid disease   Follow-up    6 months   HPI:    Lisa Newman  is a 81 y.o. Caucasian female  who presents for a follow-up for Aortic regurgitation ( Mild to Moderate by echo) and Carotid artery disease.  Past medical history significant for Raynaud's phenomena, hypertension and hyperlipidemia.  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  Patient is presently doing well, since last office visit when she was seen for palpitations, she has not had any further palpitations.  Also since increasing ARB dose, blood pressure has been well-controlled at home.  Except for wheezing during winter months, she has no specific complaints today.   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   GERD (gastroesophageal reflux disease)    Hyperlipidemia    Hypertension    Nasal congestion    Osteoporosis    Pneumonia    Raynaud disease    both hands when it gets cold   Trouble swallowing    Past Surgical History:  Procedure Laterality Date   APPENDECTOMY     COLONOSCOPY WITH PROPOFOL N/A 01/15/2015   Procedure: COLONOSCOPY WITH PROPOFOL;  Surgeon: Juanita Craver, MD;  Location: [facility];  Service: Endoscopy;  Laterality: N/A;   myomectomy     parotid biopsy     PAROTIDECTOMY     TONSILLECTOMY     Family History  Problem  Relation Age of Onset   Heart disease Brother        heart attack   Cancer Paternal Aunt        pt unaware of what kind    Social History   Tobacco Use   Smoking status: Never   Smokeless tobacco: Never   Tobacco comments:    "second hand smoke exposure"  Substance Use Topics   Alcohol use: No   ROS  Review of Systems  Cardiovascular:  Positive for dyspnea on exertion (chronic, stable h/o asthma). Negative for chest pain, claudication and palpitations.  Respiratory:  Positive for wheezing (in cold weather).   Gastrointestinal:  Negative for melena.  Neurological:  Negative for dizziness and light-headedness.   Objective  Blood pressure 130/68, pulse 76, resp. rate 18, height '5\' 1"'$  (1.549 m), weight 95 lb 9.6 oz (43.4 kg), SpO2 93 %.     09/09/2022    9:29 AM 09/09/2022    9:25 AM 08/13/2022    9:32 AM  Vitals with BMI  Height  '5\' 1"'$  '5\' 1"'$   Weight  95 lbs 10 oz 94 lbs 10 oz  BMI  55.73 22.02  Systolic 542 706 237  Diastolic 68 61 69  Pulse 76 82 78     Physical Exam Vitals reviewed.  Neck:     Vascular: Carotid bruit (  Right. Radiation sclerosis noted from RT) present. No JVD.  Cardiovascular:     Rate and Rhythm: Normal rate and regular rhythm.     Pulses: Normal pulses and intact distal pulses.     Heart sounds: A midsystolic click. Murmur heard.     Mid to late systolic murmur is present with a grade of 2/6 at the apex.     High-pitched blowing decrescendo early diastolic murmur is present with a grade of 2/4 at the lower left sternal border.     No gallop.  Pulmonary:     Effort: Pulmonary effort is normal.     Breath sounds: Wheezing (bilateral faint) present. No rales.  Musculoskeletal:     Right lower leg: No edema.     Left lower leg: No edema.  Skin:    Capillary Refill: Capillary refill takes 2 to 3 seconds.     Comments: Acrocyanosis present    Laboratory examination:   External labs:  05/26/2022: Cholesterol 135, HDL 86, LDL 34, triglycerides  73 TSH 0.92 Hemoglobin 13.3, hematocrit 41.2, platelet count 284 Potassium 4.9, sodium 142, glucose 94, BUN 26, creatinine 1.20, GFR 43   Allergies   Allergies  Allergen Reactions   Ibuprofen Itching    On bottom of feet and palms of hands.    Medications Prior to Visit:   Current Outpatient Medications:    aspirin 81 MG tablet, Take 81 mg by mouth daily.  , Disp: , Rfl:    Calcium Carbonate-Vitamin D (CALTRATE 600+D PO), Take 1 tablet by mouth daily. , Disp: , Rfl:    Cholecalciferol (VITAMIN D PO), Take 1 tablet by mouth daily. , Disp: , Rfl:    diltiazem (CARDIZEM CD) 180 MG 24 hr capsule, TAKE 1 CAPSULE (180 MG TOTAL) BY MOUTH DAILY. PLEASE OBTAIN REFILLS FROM PCP, Disp: 90 capsule, Rfl: 0   Ensure (ENSURE), See admin instructions., Disp: , Rfl:    fexofenadine (ALLEGRA) 180 MG tablet, Take 180 mg by mouth as needed for allergies. , Disp: , Rfl:    fish oil-omega-3 fatty acids 1000 MG capsule, Take 1 g by mouth daily.  , Disp: , Rfl:    methocarbamol (ROBAXIN) 500 MG tablet, Take 250 mg by mouth 2 (two) times daily as needed., Disp: , Rfl:    Multiple Vitamin (MULTIVITAMIN PO), Take 1 tablet by mouth. occas, Disp: , Rfl:    Multiple Vitamins-Minerals (AIRBORNE) TBEF, Take 1 tablet by mouth as needed., Disp: , Rfl:    olmesartan (BENICAR) 20 MG tablet, Take 1 tablet (20 mg total) by mouth daily., Disp: 90 tablet, Rfl: 3   pantoprazole (PROTONIX) 40 MG tablet, Take 40 mg by mouth as needed., Disp: , Rfl:    rosuvastatin (CRESTOR) 20 MG tablet, TAKE 1 TABLET(20 MG) BY MOUTH DAILY, Disp: 30 tablet, Rfl: 3   VENTOLIN HFA 108 (90 Base) MCG/ACT inhaler, Inhale 2 puffs into the lungs every 6 (six) hours as needed for wheezing or shortness of breath., Disp: 8 g, Rfl: 0   Radiology:   CT angiogram neck 04/02/2022: 1. Asymmetric greater atheromatous plaque in the right carotid circulation ipsilateral to prior surgery and radiotherapy. Proximal right ICA stenosis measures 60-65%. Right  external carotid occlusion. 2. 40% narrowing at the proximal right vertebral artery. 3. Vessel kink versus mild FMD in the left mid ICA.  Cardiac Studies:   Echocardiogram 11/13/2021: Left ventricle cavity is normal in size and wall thickness. Normal global wall motion. Normal LV systolic function with visual EF  55-60%. Normal diastolic filling pattern. Structurally normal trileaflet aortic valve.  Moderate (Grade II) aortic regurgitation. Moderate (Grade II) mitral regurgitation. Moderate tricuspid regurgitation. Estimated pulmonary artery systolic pressure 31 mmHg. Mild pulmonic regurgitation. No significant change compared to previous study in 2021.  Carotid artery duplex 04/22/2021: Duplex suggests stenosis in the right internal carotid artery (50-69%), upper limit of the spectrum. The right PSV internal/common carotid artery ratio off 4.28 is consistent with a stenosis of >70%. Duplex suggests stenosis in the right external carotid artery (<50%). Duplex suggests stenosis in the left internal carotid artery (16-49%). Duplex suggests stenosis in the left external carotid artery (<50%). Antegrade right vertebral artery flow. Antegrade left vertebral artery flow. No significant change from 10/22/2020. Follow up in six months is appropriate if clinically indicated.  Zio Patch Extended Out Patient EKG monitoring 6 days starting 07/14/2022:   Dominant rhythm Sinus. HR 52-112 bpm. Avg HR 72 bpm. No atrial fibrillation/atrial flutter/SVT/VT/high grade AV block, sinus pause >3sec noted. Probable atrial tachycardia with fastest interval lasting 5 beats with max HR of 156 bpm. First Degree AV Block was present. Second Degree AV Block-Mobitz I (Wenckebach) was present. Bundle Branch Block/IVCD was present. Rare PACs and PVCs 1 patient triggered events, correlated with SVEs Symptoms reported: SOB Symptoms correlated with arhythmia? No  EKG   EKG 09/09/2022: Normal sinus rhythm at rate of 72 bpm,  normal axis, right bundle branch block.  Compared to 07/14/2022, no change.     ICD-10-CM   1. Primary hypertension  I10     2. Asymptomatic bilateral carotid artery stenosis  I65.23 EKG 12-Lead    3. Moderate aortic regurgitation  I35.1     4. Raynaud's phenomenon without gangrene  I73.00       No orders of the defined types were placed in this encounter.   Medications Discontinued During This Encounter  Medication Reason   Coenzyme Q10 (CO Q-10) 100 MG CAPS    nitroGLYCERIN (NITRODUR - DOSED IN MG/24 HR) 0.1 mg/hr patch      Recommendations:   Lisa Newman  is a 81 y.o. Caucasian female  who presents for a follow-up for Aortic regurgitation ( Mild to Moderate by echo) and Carotid artery disease.  Past medical history significant for Raynaud's phenomena, hypertension and hyperlipidemia.  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  1. Primary hypertension The present medical therapy and also since increasing the ARB, blood pressure is not very well-controlled both at home and here.  Continue present medications.  2. Asymptomatic bilateral carotid artery stenosis She needs carotid artery surveillance.  Right carotid arteries approaching surgical intervention time, will continue aspirin and also statins, lipids under excellent control.  No symptoms of TIA.  3. Moderate aortic regurgitation No change in moderate aortic regurgitation over the time, no clinical evidence heart failure, no aortic regurgitation murmur appreciated.  She does have moderate MR as well suspect she has mitral valve prolapse with a clear midsystolic click and a late systolic very soft murmur at the apex.  4. Raynaud's phenomenon without gangrene She tends to have exacerbation of Raynaud's phenomenon during winter, we discussed regarding keeping her hands warm and she is very pertinent about this.  Overall stable from cardiac standpoint, I will see her back on  annual basis however she will need by annual carotid artery duplex.  If she has any significant pain during carotid artery duplex, she will obviously need CT angiogram at least  on annual basis if not biannual basis to follow-up on her carotid stenosis.   Adrian Prows, MD, [facility] 09/09/2022, 10:05 AM Office: [phone] Fax: [phone] Pager: [phone]

## 2022-11-05 ENCOUNTER — Other Ambulatory Visit: Payer: Self-pay | Admitting: Registered Nurse

## 2022-11-05 DIAGNOSIS — R1033 Periumbilical pain: Secondary | ICD-10-CM

## 2022-12-04 ENCOUNTER — Ambulatory Visit
Admission: RE | Admit: 2022-12-04 | Discharge: 2022-12-04 | Disposition: A | Discharge status: Home / Self Care | Payer: Medicare Other | Source: Ambulatory Visit | Attending: Registered Nurse | Admitting: Registered Nurse

## 2022-12-04 DIAGNOSIS — R1033 Periumbilical pain: Secondary | ICD-10-CM

## 2022-12-04 MED ORDER — IOPAMIDOL (ISOVUE-300) INJECTION 61%
80.0000 mL | Freq: Once | INTRAVENOUS | Status: AC | PRN
  Administered 2022-12-04: 80 mL via INTRAVENOUS

## 2022-12-28 ENCOUNTER — Ambulatory Visit: Payer: Medicare Other

## 2022-12-28 DIAGNOSIS — I6523 Occlusion and stenosis of bilateral carotid arteries: Secondary | ICD-10-CM

## 2023-01-14 ENCOUNTER — Telehealth: Payer: Self-pay

## 2023-01-14 NOTE — Telephone Encounter (Signed)
Patient's right coronary artery duplex was the area of interest however she did not want [place] to check that as she had significant discomfort previously, I can order angiogram CT scan to confirm home  Stenosis or we could try the study again or will recheck in 6 months but she has to let us do the Dopplers for her right neck area.

## 2023-01-14 NOTE — Telephone Encounter (Signed)
Patient called asking for her Duplex result form 12/28/2022. Please advise

## 2023-01-15 NOTE — Telephone Encounter (Signed)
Per Dr. Jacinto Halim,  Patient's right coronary artery duplex was the area of interest however she did not want [place] to check that as she had significant discomfort previously, I can order angiogram CT scan to confirm home  Stenosis or we could try the study again or will recheck in 6 months but she has to let us do the Dopplers for her right neck area.

## 2023-01-19 DIAGNOSIS — H95811 Postprocedural stenosis of right external ear canal: Secondary | ICD-10-CM | POA: Insufficient documentation

## 2023-03-17 DIAGNOSIS — H9202 Otalgia, left ear: Secondary | ICD-10-CM | POA: Insufficient documentation

## 2023-03-17 DIAGNOSIS — M2669 Other specified disorders of temporomandibular joint: Secondary | ICD-10-CM | POA: Insufficient documentation

## 2023-03-17 DIAGNOSIS — H6123 Impacted cerumen, bilateral: Secondary | ICD-10-CM | POA: Insufficient documentation

## 2023-03-18 ENCOUNTER — Other Ambulatory Visit: Payer: Self-pay | Admitting: Physician Assistant

## 2023-03-18 DIAGNOSIS — H9202 Otalgia, left ear: Secondary | ICD-10-CM

## 2023-03-19 ENCOUNTER — Other Ambulatory Visit: Payer: Self-pay | Admitting: Cardiology

## 2023-03-19 DIAGNOSIS — E7800 Pure hypercholesterolemia, unspecified: Secondary | ICD-10-CM

## 2023-03-29 ENCOUNTER — Ambulatory Visit: Payer: Medicare Other | Admitting: Cardiology

## 2023-03-29 ENCOUNTER — Encounter: Payer: Self-pay | Admitting: Cardiology

## 2023-03-29 VITALS — BP 130/70 | HR 71 | Resp 18 | Ht 61.0 in | Wt 95.0 lb

## 2023-03-29 DIAGNOSIS — R0609 Other forms of dyspnea: Secondary | ICD-10-CM

## 2023-03-29 DIAGNOSIS — I351 Nonrheumatic aortic (valve) insufficiency: Secondary | ICD-10-CM

## 2023-03-29 DIAGNOSIS — I10 Essential (primary) hypertension: Secondary | ICD-10-CM

## 2023-03-29 DIAGNOSIS — I6523 Occlusion and stenosis of bilateral carotid arteries: Secondary | ICD-10-CM

## 2023-03-29 NOTE — Progress Notes (Signed)
Primary Physician/Referring:  Georgianne Fick, MD  Patient ID: Marcelyn Bruins, female    DOB: 1941/03/04, 82 y.o.   MRN: [id number]  Chief Complaint  Patient presents with   Primary hypertension   Asymptomatic bilateral carotid artery stenosis   HPI:    AVERLY MOGEL  is a 82 y.o. 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     Patient presents for 57-month office visit, states that she has noticed mild dyspnea on exertion and occasional episodes of left upper chest pain.  No exertional component to the chest pain, last a few minutes and subside spontaneously.  Dyspnea on exertion has been chronic, no worsening dyspnea, no PND orthopnea, she is worried about blockages in her heart.  Denies leg edema, dizziness or syncope.  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   GERD (gastroesophageal reflux disease)    Hyperlipidemia    Hypertension    Nasal congestion    Osteoporosis    Pneumonia    Raynaud disease    both hands when it gets cold   Trouble swallowing    Past Surgical History:  Procedure Laterality Date   APPENDECTOMY     COLONOSCOPY WITH PROPOFOL N/A 01/15/2015   Procedure: COLONOSCOPY WITH PROPOFOL;  Surgeon: Charna Elizabeth, MD;  Location: [facility];  Service: Endoscopy;  Laterality: N/A;   myomectomy     parotid biopsy     PAROTIDECTOMY     TONSILLECTOMY     Family History  Problem  Relation Age of Onset   Heart disease Brother        heart attack   Cancer Paternal Aunt        pt unaware of what kind    Social History   Tobacco Use   Smoking status: Never   Smokeless tobacco: Never   Tobacco comments:    "second hand smoke exposure"  Substance Use Topics   Alcohol use: No   ROS  Review of Systems  Cardiovascular:  Positive for chest pain and dyspnea on exertion (chronic, stable h/o asthma). Negative for claudication and palpitations.  Respiratory:  Positive for wheezing (in cold weather).   Gastrointestinal:  Negative for melena.  Neurological:  Negative for dizziness and light-headedness.   Objective  Blood pressure 130/70, pulse 71, resp. rate 18, height 5\' 1"  (1.549 m), weight 95 lb (43.1 kg), SpO2 96 %.     03/29/2023    9:53 AM 09/09/2022    9:29 AM 09/09/2022    9:25 AM  Vitals with BMI  Height 5\' 1"   5\' 1"   Weight 95 lbs  95 lbs 10 oz  BMI 17.96  18.07  Systolic 130 130 627  Diastolic 70 68 61  Pulse 71 76 82     Physical Exam Vitals reviewed.  Neck:     Vascular: Carotid bruit (Right. Radiation sclerosis noted from RT) present. No JVD.  Cardiovascular:  Rate and Rhythm: Normal rate and regular rhythm.     Pulses: Normal pulses and intact distal pulses.     Heart sounds: A midsystolic click. Murmur heard.     Mid to late systolic murmur is present with a grade of 2/6 at the apex.     High-pitched blowing decrescendo early diastolic murmur is present with a grade of 2/4 at the lower left sternal border.     No gallop.  Pulmonary:     Effort: Pulmonary effort is normal.     Breath sounds: Wheezing (bilateral faint) present. No rales.  Musculoskeletal:     Right lower leg: No edema.     Left lower leg: No edema.  Skin:    Capillary Refill: Capillary refill takes 2 to 3 seconds.     Comments: Acrocyanosis present    Laboratory examination:   External labs:   Labs 11/29/2022:  Total cholesterol 148, triglycerides 79, HDL 91, LDL  41.  Sodium 143, potassium 4.7, BUN 29, creatinine 1.23, EGFR 40 to mL.    Vitamin D 101.0.  Labs 05/27/2022:  Total cholesterol 135, triglycerides 73, HDL 86, LDL 34.  TSH normal at 0.92.  Hb 13.3/HCT 41.2, platelets 284, normal indicis.   Radiology:   CT angiogram neck 04/02/2022: 1. Asymmetric greater atheromatous plaque in the right carotid circulation ipsilateral to prior surgery and radiotherapy. Proximal right ICA stenosis measures 60-65%. Right external carotid occlusion. 2. 40% narrowing at the proximal right vertebral artery. 3. Vessel kink versus mild FMD in the left mid ICA.  Cardiac Studies:   Echocardiogram 11/13/2021: Left ventricle cavity is normal in size and wall thickness. Normal global wall motion. Normal LV systolic function with visual EF 55-60%. Normal diastolic filling pattern. Structurally normal trileaflet aortic valve.  Moderate (Grade II) aortic regurgitation. Moderate (Grade II) mitral regurgitation. Moderate tricuspid regurgitation. Estimated pulmonary artery systolic pressure 31 mmHg. Mild pulmonic regurgitation. No significant change compared to previous study in 2021.  Zio Patch Extended Out Patient EKG monitoring 6 days starting 07/14/2022:   Dominant rhythm Sinus. HR 52-112 bpm. Avg HR 72 bpm. No atrial fibrillation/atrial flutter/SVT/VT/high grade AV block, sinus pause >3sec noted. Probable atrial tachycardia with fastest interval lasting 5 beats with max HR of 156 bpm. First Degree AV Block was present. Second Degree AV Block-Mobitz I (Wenckebach) was present. Bundle Branch Block/IVCD was present. Rare PACs and PVCs 1 patient triggered events, correlated with SVEs Symptoms reported: SOB Symptoms correlated with arhythmia? No  Carotid artery duplex 12/28/2022: Right carotid not checked patient preference due to neck pain No hemodynamically significant arterial disease in the left internal carotid artery. Antegrade left vertebral artery  flow. Compared to the study done on 11/13/2021, right ICA stenosis of >70% with ACS/CCA ratio of 4.9 could not be certain as patient did not want to have right carotid screened due to neck pain.  Consider other modality of imaging.  EKG   EKG 03/29/2023: Normal sinus rhythm at the rate of 70 bpm, normal axis, right bundle branch block.  Single PAC.  Compared to 10/10/2021, no significant change.   Allergies   Allergies  Allergen Reactions   Ibuprofen Itching    On bottom of feet and palms of hands.   Current Outpatient Medications:    aspirin 81 MG tablet, Take 81 mg by mouth daily.  , Disp: , Rfl:    Calcium Carbonate-Vitamin D (CALTRATE 600+D PO), Take 1 tablet by mouth daily. , Disp: , Rfl:    Cholecalciferol (VITAMIN  D PO), Take 1 tablet by mouth daily. , Disp: , Rfl:    diltiazem (CARDIZEM CD) 180 MG 24 hr capsule, TAKE 1 CAPSULE (180 MG TOTAL) BY MOUTH DAILY. PLEASE OBTAIN REFILLS FROM PCP, Disp: 90 capsule, Rfl: 0   Ensure (ENSURE), See admin instructions., Disp: , Rfl:    fexofenadine (ALLEGRA) 180 MG tablet, Take 180 mg by mouth as needed for allergies. , Disp: , Rfl:    fish oil-omega-3 fatty acids 1000 MG capsule, Take 1 g by mouth once a week., Disp: , Rfl:    methocarbamol (ROBAXIN) 500 MG tablet, Take 250 mg by mouth 2 (two) times daily as needed., Disp: , Rfl:    Multiple Vitamin (MULTIVITAMIN PO), Take 1 tablet by mouth. occas, Disp: , Rfl:    Multiple Vitamins-Minerals (AIRBORNE) TBEF, Take 1 tablet by mouth as needed., Disp: , Rfl:    olmesartan (BENICAR) 20 MG tablet, Take 1 tablet (20 mg total) by mouth daily., Disp: 90 tablet, Rfl: 3   pantoprazole (PROTONIX) 40 MG tablet, Take 40 mg by mouth as needed., Disp: , Rfl:    rosuvastatin (CRESTOR) 20 MG tablet, TAKE 1 TABLET(20 MG) BY MOUTH DAILY, Disp: 90 tablet, Rfl: 1   VENTOLIN HFA 108 (90 Base) MCG/ACT inhaler, Inhale 2 puffs into the lungs every 6 (six) hours as needed for wheezing or shortness of breath., Disp: 8 g,  Rfl: 0      ICD-10-CM   1. Dyspnea on exertion  R06.09 PCV MYOCARDIAL PERFUSION WITH LEXISCAN    2. Asymptomatic bilateral carotid artery stenosis  I65.23 PCV CAROTID DUPLEX (BILATERAL)    PCV MYOCARDIAL PERFUSION WITH LEXISCAN    3. Primary hypertension  I10 EKG 12-Lead    4. Moderate aortic regurgitation  I35.1 PCV ECHOCARDIOGRAM COMPLETE      No orders of the defined types were placed in this encounter.   There are no discontinued medications.    Recommendations:   KETURA SALATA  is a 82 y.o. 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     1. Dyspnea on exertion Patient's dyspnea and exertion is probably related to underlying COPD, do not suspect significant coronary disease however she has not had any cardiac evaluation in many years.  I will schedule her for Lexiscan nuclear stress test to evaluate dyspnea and also chest pain which appears very nonspecific and could be musculoskeletal.  Patient unable to do treadmill exercise stress test.  - PCV MYOCARDIAL PERFUSION WITH LEXISCAN; Future  2. Asymptomatic bilateral carotid artery stenosis She did not was to do right carotid artery scanning, but now willing to do this.  I will schedule this for 6 months.  Overall about a <70% stenosis in the right ICA has remained stable over the last year to year and a half and hence could certainly wait for 6 months. - PCV CAROTID DUPLEX (BILATERAL); Future - PCV MYOCARDIAL PERFUSION WITH LEXISCAN; Future  3. Primary hypertension Blood pressure is well-controlled, she is on appropriate medical therapy including ARB.  Lipids are also well-controlled, she is on high intensity statin. - EKG 12-Lead  4. Moderate aortic regurgitation She has mild valvular heart disease, moderate AI, moderate MR and moderate  TR.  No change in auscultation.  Will repeat echocardiogram in 6 months prior to her next office visit.  I will see her back in 6 months  unless nuclear stress test is abnormal.  External labs reviewed. - PCV ECHOCARDIOGRAM COMPLETE; Future    Yates Decamp, MD, [facility] 03/29/2023, 10:34 AM Office: [phone] Fax: [phone] Pager: [phone]

## 2023-04-14 ENCOUNTER — Other Ambulatory Visit: Payer: Medicare Other

## 2023-04-14 ENCOUNTER — Ambulatory Visit
Admission: RE | Admit: 2023-04-14 | Discharge: 2023-04-14 | Disposition: A | Discharge status: Home / Self Care | Payer: Medicare Other | Source: Ambulatory Visit | Attending: Physician Assistant | Admitting: Physician Assistant

## 2023-04-14 DIAGNOSIS — H9202 Otalgia, left ear: Secondary | ICD-10-CM

## 2023-04-14 MED ORDER — IOPAMIDOL (ISOVUE-300) INJECTION 61%
75.0000 mL | Freq: Once | INTRAVENOUS | Status: AC | PRN
  Administered 2023-04-14: 75 mL via INTRAVENOUS

## 2023-04-22 ENCOUNTER — Ambulatory Visit: Payer: Medicare Other

## 2023-04-22 DIAGNOSIS — I6523 Occlusion and stenosis of bilateral carotid arteries: Secondary | ICD-10-CM

## 2023-04-29 ENCOUNTER — Telehealth: Payer: Self-pay

## 2023-04-29 NOTE — Telephone Encounter (Signed)
No, it will not

## 2023-04-30 NOTE — Telephone Encounter (Signed)
Spoke to pt she voiced understanding.

## 2023-05-03 ENCOUNTER — Ambulatory Visit: Payer: Medicare Other

## 2023-05-03 DIAGNOSIS — R0609 Other forms of dyspnea: Secondary | ICD-10-CM

## 2023-05-03 DIAGNOSIS — I6523 Occlusion and stenosis of bilateral carotid arteries: Secondary | ICD-10-CM

## 2023-05-09 NOTE — Progress Notes (Signed)
Let her know the Carotid artery duplex shows mild improvement in her right stenosis andnoww < 70% (60% approx).   Stress test is normal  Regadenoson Nuclear stress test 05/03/2023: Myocardial perfusion is normal. Overall LV systolic function is normal without regional wall motion abnormalities. Stress LV EF: 73%.  Nondiagnostic ECG stress. The heart rate response was consistent with Regadenoson.  No previous exam available for comparison. Low risk study.

## 2023-05-09 NOTE — Progress Notes (Signed)
Carotid artery duplex 04/22/2023: Duplex suggests stenosis in the right internal carotid artery (50-69%). Duplex suggests stenosis in the left internal carotid artery (minimal). Antegrade right vertebral artery flow. Antegrade left vertebral artery flow. Comparison to 12/28/2022 not performed on the right as it was not scanned due to patient discomfort.  Patient has prior known stenosis of >70% right ICA stenosis.  This study represents stable carotid duplex. Follow up in six months is appropriate if clinically indicated.

## 2023-05-10 NOTE — Progress Notes (Signed)
Called patient to inform her about her about her stress test. Patient mention she has been with SOB, and wanted to know is there anything you can prescribe.

## 2023-05-13 ENCOUNTER — Other Ambulatory Visit: Payer: Self-pay | Admitting: Cardiology

## 2023-05-13 DIAGNOSIS — R0609 Other forms of dyspnea: Secondary | ICD-10-CM

## 2023-05-13 NOTE — Progress Notes (Signed)
Patient is aware. She will contact her PCP about her SOB.

## 2023-05-13 NOTE — Progress Notes (Signed)
Please inform her that her dyspnea is probably related to reactive airway disease. She should contact her PCP and see if there is an inhaler she needs. I am also placing an order for BNP no fasting needed

## 2023-05-27 ENCOUNTER — Other Ambulatory Visit: Payer: Medicare Other

## 2023-06-11 ENCOUNTER — Encounter: Payer: Self-pay | Admitting: Cardiology

## 2023-07-20 ENCOUNTER — Telehealth: Payer: Self-pay | Admitting: Cardiology

## 2023-07-20 NOTE — Telephone Encounter (Signed)
Pt c/o Shortness Of Breath: STAT if SOB developed within the last 24 hours or pt is noticeably SOB on the phone  1. Are you currently SOB (can you hear that pt is SOB on the phone)? no  2. How long have you been experiencing SOB? Patient states she has been having the SOB for several weeks.   3. Are you SOB when sitting or when up moving around? Mainly when she is doing something, but can happen when she is sitting.   4. Are you currently experiencing any other symptoms? Patient states when she walks she has some weakness in her legs.

## 2023-07-20 NOTE — Telephone Encounter (Signed)
Returned patient's call regarding increasing SOB on exertion. Records show she last saw Dr. Jacinto Halim in 03/29/23 and at that time she c/o mild dyspnea on exertion and occasional episodes of left upper chest pain. AT this time she denies chest pain but does endorse leg weakness on exertion stating, "My legs get tired". She states she does use her inhaler for her COPD and used it yesterday during an episode of SOB but states "it doesn't help much." She states her SOB is improved with rest, but states sometimes she becomes SOB at rest. She does not check her HR or BP at home and denies any leg swelling.  Offered appointment with APP on 08/09/23 but patient declines this, stating she feels she needs to be seen sooner. Offered DOD appt on 07/23/23, patient agrees to plan. Patient currently has follow up with Dr. Jacinto Halim in December 2024.

## 2023-07-23 ENCOUNTER — Encounter: Payer: Self-pay | Admitting: Cardiology

## 2023-07-23 ENCOUNTER — Ambulatory Visit: Payer: Medicare Other | Attending: Cardiology | Admitting: Cardiology

## 2023-07-23 VITALS — BP 140/60 | HR 81 | Ht 61.0 in | Wt 95.8 lb

## 2023-07-23 DIAGNOSIS — R0609 Other forms of dyspnea: Secondary | ICD-10-CM | POA: Diagnosis present

## 2023-07-23 DIAGNOSIS — R062 Wheezing: Secondary | ICD-10-CM

## 2023-07-23 DIAGNOSIS — I351 Nonrheumatic aortic (valve) insufficiency: Secondary | ICD-10-CM | POA: Diagnosis present

## 2023-07-23 NOTE — Progress Notes (Signed)
Cardiology Office Note:  .   Date:  07/23/2023  ID:  Lisa Newman, DOB 07-11-1941, MRN [id number] PCP: Fatima Sanger, FNP  [facility] Providers Cardiologist:  None    History of Present Illness: .   Lisa Newman is a 82 y.o. 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 She presents with increasing shortness of breath and leg weakness on exertion. The dyspnea began in July and has been gradually worsening. The patient also experiences occasional chest pain.  The patient has been using an inhaler for COPD, which provides minimal relief. She also reports a history of asthma in childhood, but currently only experiences occasional wheezing. The patient has a history of smoking in her teenage years and was exposed to secondhand smoke from a spouse who was a chain smoker.  The patient's most recent labs in March 2024 showed an LDL of 41, sodium 143, potassium 4.7, creatinine 1.23, and TSH 0.92. A CTA angiogram of the neck in July 2023 revealed proximal right ICA stenosis of about 60-65%, right external carotid occlusion, 40% narrowing of the proximal right vertebral artery, and vessel kink versus mild fibromuscular dysplasia in the left mid ICA. An echocardiogram in February 2023 showed a normal ejection fraction of 60% with grade 2 aortic regurgitation, moderate mitral regurgitation, and a normal diastolic filling pattern. An event monitor showed no atrial fibrillation.  The patient also has Raynaud's phenomenon, which causes her fingers to turn white when cold. This condition has been exacerbated by exposure to cold objects and environments. The patient also has a history of parotid gland carcinoma, which was  treated with chemotherapy and radiation therapy in 1995-1996. The patient believes the Raynaud's phenomenon may be a side effect of this treatment.  The patient has been trying to stay active to manage her symptoms, using an exercise bike. However, she reports that her legs become weak after a short period of exercise. The patient has an upcoming echocardiogram scheduled for December.          Studies Reviewed: .        Results LABS LDL: 41 (11/2022) Na: 143 (11/2022) K: 4.7 (11/2022) Cr: 1.23 (11/2022) TSH: 0.92 (11/2022)  RADIOLOGY CTA angiogram of neck: Proximal right ICA stenosis 60-65%, right external carotid occlusion 40%, proximal right vertebral artery 40% narrowing, vessel kink vs mild fibromuscular dysplasia in left mid ICA (03/2022) CT scan: Nodules, possible radiation effects  DIAGNOSTIC Echocardiogram: Normal ejection fraction 60%, grade 2 aortic regurgitation, moderate mitral regurgitation, normal diastolic filling pattern (11/13/2021) Event monitor: No atrial fibrillation  Risk Assessment/Calculations:           Physical Exam:   VS:  BP (!) 140/60   Pulse 81   Ht 5\' 1"  (1.549 m)   Wt 95 lb 12.8 oz (43.5 kg)   SpO2 96%   BMI 18.10 kg/m    Wt Readings from Last 3 Encounters:  07/23/23 95 lb 12.8 oz (43.5 kg)  03/29/23 95 lb (43.1 kg)  09/09/22 95 lb 9.6 oz (43.4 kg)    GEN: Thin, well developed in no acute distress NECK: Right facial nerve palsy CARDIAC: RRR, no murmurs, no rubs, no gallops RESPIRATORY:  wheeze Bilaterally end expiratory  ABDOMEN: Soft, non-tender, non-distended EXTREMITIES:  No  edema; No deformity   ASSESSMENT AND PLAN: .    Assessment and Plan    Shortness of Breath and Leg Weakness Increasing dyspnea on exertion and leg weakness. Pulses palpable bilaterally in lower extremities, suggesting adequate peripheral circulation. Wheezing noted on auscultation, suggesting possible airway disease -Refer to pulmonology for further  evaluation and management.  Aortic Regurgitation and Carotid Artery Disease Stable with no new symptoms. Echocardiogram scheduled for September 01, 2023, to reassess aortic regurgitation. -Continue current management and follow-up with Dr. Jacinto Halim post-echocardiogram.  Raynaud's Phenomenon Chronic condition managed with gloves and avoidance of cold exposure. -Continue current management.  History of Parotid Gland Carcinoma Status post excision and radiation therapy in 1996. No new symptoms or concerns. -Continue routine follow-up and surveillance.  Hypertension and Hyperlipidemia Well-controlled with last LDL of 41 in March 2024. -Continue current management.              Signed, Donato Schultz, MD

## 2023-07-23 NOTE — Patient Instructions (Signed)
Medication Instructions:  The current medical regimen is effective;  continue present plan and medications.  *If you need a refill on your cardiac medications before your next appointment, please call your pharmacy*  You have been referred to Pulmonary for further evaluation.  Follow-Up: At [facility], you and your health needs are our priority.  As part of our continuing mission to provide you with exceptional heart care, we have created designated Provider Care Teams.  These Care Teams include your primary Cardiologist (physician) and Advanced Practice Providers (APPs -  Physician Assistants and Nurse Practitioners) who all work together to provide you with the care you need, when you need it.  We recommend signing up for the patient portal called "MyChart".  Sign up information is provided on this After Visit Summary.  MyChart is used to connect with patients for Virtual Visits (Telemedicine).  Patients are able to view lab/test results, encounter notes, upcoming appointments, etc.  Non-urgent messages can be sent to your provider as well.   To learn more about what you can do with MyChart, go to [email or web address].    Your next appointment:   Follow up as scheduled with Dr Jacinto Halim.

## 2023-09-01 ENCOUNTER — Other Ambulatory Visit: Payer: Medicare Other

## 2023-09-01 ENCOUNTER — Ambulatory Visit (HOSPITAL_COMMUNITY): Payer: Medicare Other | Attending: Cardiology

## 2023-09-01 DIAGNOSIS — I351 Nonrheumatic aortic (valve) insufficiency: Secondary | ICD-10-CM | POA: Diagnosis not present

## 2023-09-01 LAB — ECHOCARDIOGRAM COMPLETE
Area-P 1/2: 3.21 cm2
P 1/2 time: 508 ms
S' Lateral: 2 cm

## 2023-09-05 NOTE — Progress Notes (Signed)
Stable echo and will discuss on visit soon

## 2023-09-09 ENCOUNTER — Encounter: Payer: Self-pay | Admitting: Cardiology

## 2023-09-09 ENCOUNTER — Ambulatory Visit: Payer: Medicare Other | Attending: Cardiology | Admitting: Cardiology

## 2023-09-09 VITALS — BP 130/78 | HR 80 | Resp 18 | Ht 61.0 in | Wt 96.8 lb

## 2023-09-09 DIAGNOSIS — I6523 Occlusion and stenosis of bilateral carotid arteries: Secondary | ICD-10-CM | POA: Insufficient documentation

## 2023-09-09 DIAGNOSIS — I10 Essential (primary) hypertension: Secondary | ICD-10-CM | POA: Insufficient documentation

## 2023-09-09 DIAGNOSIS — R0609 Other forms of dyspnea: Secondary | ICD-10-CM | POA: Insufficient documentation

## 2023-09-09 DIAGNOSIS — I351 Nonrheumatic aortic (valve) insufficiency: Secondary | ICD-10-CM | POA: Insufficient documentation

## 2023-09-09 NOTE — Patient Instructions (Addendum)
Medication Instructions:  Your physician recommends that you continue on your current medications as directed. Please refer to the Current Medication list given to you today.  *If you need a refill on your cardiac medications before your next appointment, please call your pharmacy*   Lab Work: none If you have labs (blood work) drawn today and your tests are completely normal, you will receive your results only by: MyChart Message (if you have MyChart) OR A paper copy in the mail If you have any lab test that is abnormal or we need to change your treatment, we will call you to review the results.   Testing/Procedures: Your physician has requested that you have a carotid duplex.To be done in 6 months.  This test is an ultrasound of the carotid arteries in your neck. It looks at blood flow through these arteries that supply the brain with blood. Allow one hour for this exam. There are no restrictions or special instructions.    Follow-Up: At [facility], you and your health needs are our priority.  As part of our continuing mission to provide you with exceptional heart care, we have created designated Provider Care Teams.  These Care Teams include your primary Cardiologist (physician) and Advanced Practice Providers (APPs -  Physician Assistants and Nurse Practitioners) who all work together to provide you with the care you need, when you need it.  We recommend signing up for the patient portal called "MyChart".  Sign up information is provided on this After Visit Summary.  MyChart is used to connect with patients for Virtual Visits (Telemedicine).  Patients are able to view lab/test results, encounter notes, upcoming appointments, etc.  Non-urgent messages can be sent to your provider as well.   To learn more about what you can do with MyChart, go to [email or web address].    Your next appointment:   6 month(s)  Provider:   Yates Decamp, MD     Other Instructions

## 2023-09-09 NOTE — Progress Notes (Signed)
Cardiology Office Note:  .   Date:  09/09/2023  ID:  Lisa Newman, DOB [birth year], MRN [id number] PCP: Fatima Sanger, FNP  [facility] Providers Cardiologist:  Yates Decamp, MD   History of Present Illness: .   Lisa Newman is a 82 y.o. 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  She was seen a month ago by my partner Dr. Donato Schultz for worsening dyspnea and referred to pulmonary evaluation.  Discussed the use of AI scribe software for clinical note transcription with the patient, who gave verbal consent to proceed.  History of Present Illness   The patient, with a history of parotid gland tumor treated with surgery, radiation, and chemotherapy, presents with shortness of breath. She first noticed the symptom during a stress test in the summer and it has been stable since. 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 She denies orthopnea and leg swelling. She has been using an inhaler as needed for wheezing, prescribed by her allergist. She also reports palpitations, described as three heartbeats followed by a pause, which she wonders may be contributing to her shortness of breath.        Review of Systems  Cardiovascular:  Positive for dyspnea on exertion and palpitations. Negative for chest pain and leg swelling.    Labs    External Labs:  Labs 06/03/2023:  Serum glucose 88 mg, BUN 37, creatinine 1.19, EGFR 47 mL, potassium 4.5.  LFTs normal.  Hb 14.6/HCT 46.3, platelets 262, normal indicis.  Total cholesterol 144, triglycerides 67, HDL 105, LDL 26.  TSH normal at 1.060.  Vitamin D82.3.  Physical Exam:   VS:  BP 130/78 (BP Location: Left Arm, Patient Position: Sitting, Cuff Size: Normal)   Pulse 80   Resp 18   Ht 5\' 1"  (1.549 m)    Wt 96 lb 12.8 oz (43.9 kg)   SpO2 93%   BMI 18.29 kg/m    Wt Readings from Last 3 Encounters:  09/09/23 96 lb 12.8 oz (43.9 kg)  07/23/23 95 lb 12.8 oz (43.5 kg)  03/29/23 95 lb (43.1 kg)     Physical Exam Neck:     Vascular: No carotid bruit or JVD.  Cardiovascular:     Rate and Rhythm: Normal rate and regular rhythm.     Pulses: Intact distal pulses.     Heart sounds: Normal heart sounds. No murmur heard.    No gallop.  Pulmonary:     Effort: Pulmonary effort is normal.     Breath sounds: Wheezing (scattered) present.  Abdominal:     General: Bowel sounds are normal.     Palpations: Abdomen is soft.  Musculoskeletal:     Right lower leg: No edema.     Left lower leg: No edema.     Studies Reviewed: .    Regadenoson Nuclear stress test 05/03/2023: Myocardial perfusion is normal. Overall LV systolic function is normal without regional wall motion abnormalities. Stress LV EF: 73%. Nondiagnostic ECG stress. The heart rate response was consistent with Regadenoson. No previous exam available for comparison. Low risk study.  ECHOCARDIOGRAM COMPLETE 09/01/2023  1. Left ventricular ejection fraction, by estimation, is 60 to 65%. The left ventricle has normal function. The left ventricle has no regional wall motion abnormalities. There is mild left ventricular hypertrophy. Left ventricular diastolic  parameters are consistent with Grade I diastolic dysfunction (impaired relaxation). 2. Right ventricular systolic function is normal. The right ventricular size is normal. 3. The mitral valve is degenerative. Mild mitral valve regurgitation. 4. The aortic valve is tricuspid. Aortic valve regurgitation is mild to moderate. 5. The inferior vena cava is normal in size with greater than 50% respiratory variability, suggesting right atrial pressure of 3 mmHg.  Overall no significant change from 11/13/2021.  EKG:   EKG 09/09/2022: Normal sinus rhythm at rate of 72 bpm, normal axis, right  bundle branch block.   Medications and allergies    Allergies  Allergen Reactions   Ibuprofen Itching    On bottom of feet and palms of hands.     Current Outpatient Medications:    aspirin 81 MG tablet, Take 81 mg by mouth daily.  , Disp: , Rfl:    Calcium Carbonate-Vitamin D (CALTRATE 600+D PO), Take 1 tablet by mouth daily. , Disp: , Rfl:    Cholecalciferol (VITAMIN D PO), Take 1 tablet by mouth daily. , Disp: , Rfl:    diltiazem (CARDIZEM CD) 180 MG 24 hr capsule, TAKE 1 CAPSULE (180 MG TOTAL) BY MOUTH DAILY. PLEASE OBTAIN REFILLS FROM PCP, Disp: 90 capsule, Rfl: 0   Ensure (ENSURE), See admin instructions., Disp: , Rfl:    fexofenadine (ALLEGRA) 180 MG tablet, Take 180 mg by mouth as needed for allergies. , Disp: , Rfl:    Multiple Vitamin (MULTIVITAMIN PO), Take 1 tablet by mouth. occas, Disp: , Rfl:    Multiple Vitamins-Minerals (AIRBORNE) TBEF, Take 1 tablet by mouth as needed., Disp: , Rfl:    olmesartan (BENICAR) 20 MG tablet, Take 1 tablet (20 mg total) by mouth daily., Disp: 90 tablet, Rfl: 3   pantoprazole (PROTONIX) 40 MG tablet, Take 40 mg by mouth as needed., Disp: , Rfl:    rosuvastatin (CRESTOR) 20 MG tablet, TAKE 1 TABLET(20 MG) BY MOUTH DAILY, Disp: 90 tablet, Rfl: 1   VENTOLIN HFA 108 (90 Base) MCG/ACT inhaler, Inhale 2 puffs into the lungs every 6 (six) hours as needed for wheezing or shortness of breath., Disp: 8 g, Rfl: 0   fish oil-omega-3 fatty acids 1000 MG capsule, Take 1 g by mouth once a week. (Patient not taking: Reported on 07/23/2023), Disp: , Rfl:    ASSESSMENT AND PLAN: .      ICD-10-CM   1. Dyspnea on exertion  R06.09     2. Moderate aortic regurgitation  I35.1     3. Primary hypertension  I10     4. Asymptomatic bilateral carotid artery stenosis  I65.23 VAS US CAROTID     Assessment and Plan    Shortness of Breath Increased with exertion, no nocturnal symptoms. Wheezing noted on exam. Possible causes include lung disease or effects of  prior radiation therapy for parotid gland tumor. In view of Raynaud's phenomena, prior radiation therapy to her neck although not to the chest, both interstitial lung disease, primary pulmonary hypertension are in the differential however fortunately echocardiogram does not reveal any evidence of RV strain and clinical exam does not suggest pulmonary hypertension either. She has also had a low risk nuclear stress test a year ago, with no exertional chest pain, suspicion for coronary disease is low.  -Continue use of prescribed inhaler as needed, consider daily use due to current wheezing. -Follow-up appointment with pulmonology on 10/19/2023.  Aortic Valve Regurgitation Mild to moderate leakage, stable. Not likely contributing to shortness of breath. -Continue current management.  Carotid Artery Disease 40-50% blockage on the right side. Aspirin therapy ongoing.  She is already on a statin with LDL under excellent control. -Continue daily aspirin. -Recheck carotid duplex in summer 2025.  Hypertension Well controlled with Diltiazem CD 180mg  daily. -Continue current medication regimen.  Premature Ventricular Contractions Occasional, possibly related to lung disease. Not likely contributing to shortness of breath. -No change in management.  Follow-up in 6 months.    Signed,  Yates Decamp, MD, [facility] 09/09/2023, 10:34 AM [facility] [address] [place], [place] [address] Phone: [phone]. Fax:  [phone]

## 2023-09-23 ENCOUNTER — Other Ambulatory Visit: Payer: Self-pay | Admitting: Cardiology

## 2023-09-23 DIAGNOSIS — E7800 Pure hypercholesterolemia, unspecified: Secondary | ICD-10-CM

## 2023-10-19 ENCOUNTER — Institutional Professional Consult (permissible substitution): Payer: Medicare Other | Admitting: Internal Medicine

## 2023-10-29 ENCOUNTER — Other Ambulatory Visit: Payer: Self-pay | Admitting: Family Medicine

## 2023-10-29 DIAGNOSIS — M51369 Other intervertebral disc degeneration, lumbar region without mention of lumbar back pain or lower extremity pain: Secondary | ICD-10-CM

## 2023-11-17 ENCOUNTER — Institutional Professional Consult (permissible substitution): Payer: Medicare Other | Admitting: Internal Medicine

## 2024-01-04 ENCOUNTER — Ambulatory Visit (INDEPENDENT_AMBULATORY_CARE_PROVIDER_SITE_OTHER): Payer: Medicare Other | Admitting: Internal Medicine

## 2024-01-04 ENCOUNTER — Encounter: Payer: Self-pay | Admitting: Internal Medicine

## 2024-01-04 VITALS — BP 140/70 | HR 75 | Ht 61.0 in | Wt 96.8 lb

## 2024-01-04 DIAGNOSIS — C07 Malignant neoplasm of parotid gland: Secondary | ICD-10-CM

## 2024-01-04 DIAGNOSIS — J301 Allergic rhinitis due to pollen: Secondary | ICD-10-CM

## 2024-01-04 DIAGNOSIS — J45901 Unspecified asthma with (acute) exacerbation: Secondary | ICD-10-CM | POA: Diagnosis not present

## 2024-01-04 DIAGNOSIS — W888XXA Exposure to other ionizing radiation, initial encounter: Secondary | ICD-10-CM

## 2024-01-04 DIAGNOSIS — J4530 Mild persistent asthma, uncomplicated: Secondary | ICD-10-CM | POA: Diagnosis not present

## 2024-01-04 DIAGNOSIS — Z808 Family history of malignant neoplasm of other organs or systems: Secondary | ICD-10-CM

## 2024-01-04 DIAGNOSIS — Z7722 Contact with and (suspected) exposure to environmental tobacco smoke (acute) (chronic): Secondary | ICD-10-CM | POA: Diagnosis not present

## 2024-01-04 DIAGNOSIS — J701 Chronic and other pulmonary manifestations due to radiation: Secondary | ICD-10-CM | POA: Diagnosis not present

## 2024-01-04 MED ORDER — ALBUTEROL SULFATE (2.5 MG/3ML) 0.083% IN NEBU: 2.5000 mg | INHALATION_SOLUTION | Freq: Four times a day (QID) | RESPIRATORY_TRACT | 12 refills | Status: AC | PRN

## 2024-01-04 MED ORDER — BUDESONIDE 0.25 MG/2ML IN SUSP: 0.2500 mg | Freq: Two times a day (BID) | RESPIRATORY_TRACT | 12 refills | Status: DC

## 2024-01-04 NOTE — Progress Notes (Signed)
 Lisa Newman    [id number]    Mar 06, 1941  Primary Care Physician:Prevost, Gaylyn Lambert, FNP  Referring Physician: Fatima Sanger, FNP [address] [place],  [place] [address] Reason for Consultation: shortness of breath, history of asthma Date of Consultation: 01/04/2024  Chief complaint:   Chief Complaint  Patient presents with   Consult    History of asthma as a kid. Referral for sob and wheezing. Started last year.     HPI:  Discussed the use of AI scribe software for clinical note transcription with the patient, who gave verbal consent to proceed.  History of Present Illness Lisa Newman is a 83 year old female with parotid cancer who presents with shortness of breath and wheezing. She was referred by her heart doctor for evaluation of wheezing and shortness of breath.  She has been experiencing shortness of breath and wheezing since October of the previous year, initially noted during a visit with her heart doctor. The symptoms have persisted, with increased shortness of breath over the past few days, possibly exacerbated by allergies.  She uses Flonase nasal spray and takes Allegra for allergies. She has a rescue inhaler, albuterol, which she uses as needed, but reports difficulty in effectively using it due to issues with getting the medication into her lungs. She has not used the inhaler in the past week.  Her asthma symptoms are triggered by physical activity, as she recalls from childhood experiences of asthma attacks during play. She lives independently and engages in activities such as using a riding Surveyor, mining and weed eating, which can cause shortness of breath.  She has a significant past medical history of parotid cancer treated with surgery, radiation, and chemotherapy in the mid-1990s. A recent CT scan in July 2024 showed chronic changes in the top of her right lung, likely due to past radiation therapy.  She has a history of  secondhand smoke exposure from a previous marriage to a chain smoker. She has not smoked herself, aside from a brief period as a teenager. She has worked in various industries, including sewing, Optician, dispensing, and pharmaceuticals. She lives alone and manages her daily activities independently.   Social history:  Occupation: Neurosurgeon, Radio producer Exposures: lives independently.  Smoking history: never smoker, passive smoke exposure for 30 years  Social History   Occupational History   Not on file  Tobacco Use   Smoking status: Never   Smokeless tobacco: Never   Tobacco comments:    "second hand smoke exposure"  Vaping Use   Vaping status: Never Used  Substance and Sexual Activity   Alcohol use: No   Drug use: No   Sexual activity: Not on file    Relevant family history:  Family History  Problem Relation Age of Onset   Heart disease Brother        heart attack   Cancer Paternal Aunt        pt unaware of what kind    Past Medical History:  Diagnosis Date   Arthritis    Asthma    Carcinoma (HCC)    parotid '96- surgery, chemothrapy, radiation -last surgery 2'96([facility])   Carcinoma of parotid gland (HCC)    Constipation    COPD (chronic obstructive pulmonary disease) (HCC)    carries Proair Inhaler-has not used   Emphysema/COPD (HCC)    Fall at home    01-04-15 a week ago, loss balance after tripped "soreness" generalized around  hip area".   GERD (gastroesophageal reflux disease)    Hyperlipidemia    Hypertension    Nasal congestion    Osteoporosis    Pneumonia    Raynaud disease    both hands when it gets cold   Trouble swallowing     Past Surgical History:  Procedure Laterality Date   APPENDECTOMY     COLONOSCOPY WITH PROPOFOL N/A 01/15/2015   Procedure: COLONOSCOPY WITH PROPOFOL;  Surgeon: Charna Elizabeth, MD;  Location: [facility];  Service: Endoscopy;  Laterality: N/A;   myomectomy     parotid biopsy     PAROTIDECTOMY     TONSILLECTOMY        Physical Exam: Blood pressure (!) 140/70, pulse 75, height 5\' 1"  (1.549 m), weight 96 lb 12.8 oz (43.9 kg), SpO2 100%. Gen:      No acute distress, thin, frail ENT:  right parotid cancer changes.  Lungs:    No increased respiratory effort, symmetric chest wall excursion, clear to auscultation bilaterally, no wheezes or crackles CV:         Regular rate and rhythm; no murmurs, rubs, or gallops.  No pedal edema Abd:      + bowel sounds; soft, non-tender; no distension MSK: no acute synovitis of DIP or PIP joints, no mechanics hands.  Skin:      Warm and dry; no rashes Neuro: slight dysarthria Psych: alert and oriented x3, normal mood and affect   Data Reviewed/Medical Decision Making:  Independent interpretation of tests: Imaging:  Review of patient's CT Chest images sept 2011 revealed mild peribronchial thickening mild tree in bud changes. The patient's images have been independently reviewed by me.    PFTs: I have personally reviewed the patient's PFTs and normal pulmonary function    Latest Ref Rng & Units 09/13/2014   10:02 AM  PFT Results  FVC-Pre L 1.98   FVC-Predicted Pre % 72   FVC-Post L 2.15   FVC-Predicted Post % 79   Pre FEV1/FVC % % 71   Post FEV1/FCV % % 73   FEV1-Pre L 1.41   FEV1-Predicted Pre % 68   FEV1-Post L 1.57   DLCO uncorrected ml/min/mmHg 16.31   DLCO UNC% % 75   DLVA Predicted % 98   TLC L 6.59   TLC % Predicted % 138   RV % Predicted % 216     Labs:  Lab Results  Component Value Date   NA 140 02/17/2010   K 4.3 02/17/2010   CO2 29 02/17/2010   GLUCOSE 93 02/17/2010   BUN 26 (H) 02/17/2010   CREATININE 0.97 02/17/2010   CALCIUM 9.8 02/17/2010   Lab Results  Component Value Date   WBC 6.1 02/17/2010   HGB 13.4 02/17/2010   HCT 39.4 02/17/2010   MCV 95.4 02/17/2010   PLT 224 02/17/2010      Immunization status:  Immunization History  Administered Date(s) Administered   PFIZER(Purple Top)SARS-COV-2 Vaccination 11/18/2019,  12/13/2019     I reviewed prior external note(s) from   I reviewed the result(s) of the labs and imaging as noted above.   I have ordered feno   Assessment and Plan Assessment & Plan Asthma Shortness of breath and wheezing consistent with asthma. Exacerbations triggered by physical activity and allergies. Albuterol inhaler use limited by anatomical changes from previous cancer treatment. - Order exhaled nitric oxide test to assess allergic inflammation - she was not able to follow instructions to complete the maneuver - Prescribe budesonide nebulizer  treatment twice daily. - Order nebulizer machine for home use. - Prescribe albuterol nebulizer solution for use up to four times daily as needed. - Instruct her to gargle and rinse mouth after budesonide use to prevent thrush. - Follow up with nurse practitioner in a month to assess treatment efficacy. - I gave her a breathing treatment in the office today since she has some dyspnea, and has never had a breathing treatment before and feels very uncertain of starting this therapy.   Allergic rhinitis Worsening allergies may contribute to respiratory symptoms. Current treatment with fluticasone nasal spray and Allegra insufficient. Increase use to daily.  Chronic changes from radiation therapy Radiation therapy for parotid cancer in 1995 caused chronic changes in the right lung, contributing to respiratory symptoms.     Return to Care: Return in about 4 weeks (around 02/01/2024) for with APP for asthma follow up.  Durel Salts, MD Pulmonary and Critical Care Medicine [facility] [phone]  CC: Larose Hires Gaylyn Lambert, FNP

## 2024-01-04 NOTE — Patient Instructions (Addendum)
 VISIT SUMMARY:  Today, you were seen for shortness of breath and wheezing, which have been ongoing since last October. These symptoms are likely related to your asthma, which is triggered by physical activity and allergies. 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 Your current allergy medications were reviewed, and a new treatment plan was created to help manage your symptoms more effectively.   INSTRUCTIONS:  We have ordered an exhaled nitric oxide test to assess allergic inflammation. Starting you on budesonide nebulizer treatment once in the morning, once at night, gargle after use. Prescribing albuterol nebulizer medication up to 4 times a day as needed for shortness of breath, chest tightness, wheezing. Please follow up with the nurse practitioner in a month to evaluate how well the new treatments are working.  YOUR PLAN:  -ASTHMA: Asthma is a condition where your airways narrow and swell, making it difficult to breathe. To better manage your asthma, we will start you on a budesonide nebulizer treatment twice daily and provide a nebulizer machine for home use. You will also have albuterol nebulizer solution to use up to four times daily as needed. Please remember to gargle and rinse your mouth after using budesonide to prevent thrush. We will follow up in a month to see how you are doing.  -ALLERGIC RHINITIS: Allergic rhinitis is an allergic reaction that causes sneezing, congestion, and a runny nose. Your current treatment with Flonase nasal spray and Allegra will continue. Take them everyda for the best improvement.  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 These changes are permanent, but we will manage your symptoms as best as we can.

## 2024-01-04 NOTE — Addendum Note (Signed)
 Addended by: Maisie Fus on: 01/04/2024 04:27 PM   Modules accepted: Orders

## 2024-01-05 MED ORDER — ALBUTEROL SULFATE (2.5 MG/3ML) 0.083% IN NEBU
2.5000 mg | INHALATION_SOLUTION | Freq: Once | RESPIRATORY_TRACT | Status: AC
  Administered 2024-01-05: 2.5 mg via RESPIRATORY_TRACT

## 2024-01-05 NOTE — Addendum Note (Signed)
 Addended by: Maisie Fus on: 01/05/2024 09:34 AM   Modules accepted: Orders

## 2024-01-31 ENCOUNTER — Ambulatory Visit: Admitting: Internal Medicine

## 2024-01-31 ENCOUNTER — Telehealth: Payer: Self-pay

## 2024-01-31 ENCOUNTER — Encounter: Payer: Self-pay | Admitting: Internal Medicine

## 2024-01-31 VITALS — BP 121/60 | HR 77 | Ht 61.0 in | Wt 95.6 lb

## 2024-01-31 DIAGNOSIS — J4530 Mild persistent asthma, uncomplicated: Secondary | ICD-10-CM

## 2024-01-31 DIAGNOSIS — R0602 Shortness of breath: Secondary | ICD-10-CM

## 2024-01-31 MED ORDER — ALBUTEROL SULFATE HFA 108 (90 BASE) MCG/ACT IN AERS
2.0000 | INHALATION_SPRAY | Freq: Four times a day (QID) | RESPIRATORY_TRACT | 5 refills | Status: AC | PRN
Start: 2024-01-31 — End: ?

## 2024-01-31 MED ORDER — VENTOLIN HFA 108 (90 BASE) MCG/ACT IN AERS: 2.0000 | INHALATION_SPRAY | Freq: Four times a day (QID) | RESPIRATORY_TRACT | 5 refills | Status: DC | PRN

## 2024-01-31 NOTE — Telephone Encounter (Signed)
 Yes. I resent to pharmacy. nfn

## 2024-01-31 NOTE — Patient Instructions (Addendum)
 It was a pleasure to see you today!  Please schedule follow up with myself in 3 months.  If my schedule is not open yet, we will contact you with a reminder closer to that time. Please call [phone] if you haven't heard from us  a month before, and always call us  sooner if issues or concerns arise. You can also send us  a message through MyChart, but but aware that this is not to be used for urgent issues and it may take up to 5-7 days to receive a reply. Please be aware that you will likely be able to view your results before I have a chance to respond to them. Please give us  5 business days to respond to any non-urgent results.    Continue albuterol  inhaler 2 puffs every 4 hours as needed.   I have sent the nebulizer machine to a new DME company. I am also sending budesonide  nebulizer treatments to this company to be covered under medicare part B.

## 2024-01-31 NOTE — Progress Notes (Signed)
 Lisa Newman    [id number]    [birth year]  Primary Care Physician:Prevost, Arlice Lai, FNP Date of Appointment: 01/31/2024 Established Patient Visit  Chief complaint:   Chief Complaint  Patient presents with   Follow-up     HPI: Lisa Newman is a 83 y.o. woman with shortness of breath, possible asthma  Interval Updates: Here for follow up. Using albuterol  inhaler only Never got a nebulizer machine. They said they called her to deliver and told her it wasn't covered. Still short of breath. Albuterol  does help. No interval exacerbations or hospitalizations.   I have reviewed the patient's family social and past medical history and updated as appropriate.   Past Medical History:  Diagnosis Date   Arthritis    Asthma    Carcinoma (HCC)    parotid '96- surgery, chemothrapy, radiation -last surgery 2'96([facility])   Carcinoma of parotid gland (HCC)    Constipation    COPD (chronic obstructive pulmonary disease) (HCC)    carries Proair  Inhaler-has not used   Emphysema/COPD (HCC)    Fall at home    01-04-15 a week ago, loss balance after tripped "soreness" generalized around hip area".   GERD (gastroesophageal reflux disease)    Hyperlipidemia    Hypertension    Nasal congestion    Osteoporosis    Pneumonia    Raynaud disease    both hands when it gets cold   Trouble swallowing     Past Surgical History:  Procedure Laterality Date   APPENDECTOMY     COLONOSCOPY WITH PROPOFOL  N/A 01/15/2015   Procedure: COLONOSCOPY WITH PROPOFOL ;  Surgeon: Tami Falcon, MD;  Location: [facility];  Service: Endoscopy;  Laterality: N/A;   myomectomy     parotid biopsy     PAROTIDECTOMY     TONSILLECTOMY      Family History  Problem Relation Age of Onset   Heart disease Brother        heart attack   Cancer Paternal Aunt        pt unaware of what kind    Social History   Occupational History   Not on file  Tobacco Use   Smoking status: Never    Passive  exposure: Past   Smokeless tobacco: Never   Tobacco comments:    "second hand smoke exposure"  Vaping Use   Vaping status: Never Used  Substance and Sexual Activity   Alcohol use: No   Drug use: No   Sexual activity: Not on file     Physical Exam: Blood pressure 121/60, pulse 77, height 5\' 1"  (1.549 m), weight 95 lb 9.6 oz (43.4 kg), SpO2 99%.  Gen:      No acute distress, thin, elderly ENT: right sided facial droop Lungs:    No increased respiratory effort, symmetric chest wall excursion, clear to auscultation bilaterally, no wheezes or crackles CV:         Regular rate and rhythm; no murmurs, rubs, or gallops.  No pedal edema   Data Reviewed: Imaging: I have personally reviewed the   PFTs:     Latest Ref Rng & Units 09/13/2014   10:02 AM  PFT Results  FVC-Pre L 1.98   FVC-Predicted Pre % 72   FVC-Post L 2.15   FVC-Predicted Post % 79   Pre FEV1/FVC % % 71   Post FEV1/FCV % % 73   FEV1-Pre L 1.41   FEV1-Predicted Pre % 68   FEV1-Post L  1.57   DLCO uncorrected ml/min/mmHg 16.31   DLCO UNC% % 75   DLVA Predicted % 98   TLC L 6.59   TLC % Predicted % 138   RV % Predicted % 216    I have personally reviewed the patient's PFTs and normal pulmonary function  Labs: Lab Results  Component Value Date   WBC 6.1 02/17/2010   HGB 13.4 02/17/2010   HCT 39.4 02/17/2010   MCV 95.4 02/17/2010   PLT 224 02/17/2010   Lab Results  Component Value Date   NA 140 02/17/2010   K 4.3 02/17/2010   CO2 29 02/17/2010   GLUCOSE 93 02/17/2010   BUN 26 (H) 02/17/2010   CREATININE 0.97 02/17/2010   CALCIUM  9.8 02/17/2010    Immunization status: Immunization History  Administered Date(s) Administered   PFIZER(Purple Top)SARS-COV-2 Vaccination 11/18/2019, 12/13/2019    External Records Personally Reviewed:   Assessment:  Shortness of breath Possible asthma  Plan/Recommendations: Re-prescrbing albuterol  inhaler Will send budesonide  nebs and nebulizer machine to new  dme company.    Return to Care: Return in about 4 months (around 06/02/2024).   Lisa Rover, MD Pulmonary and Critical Care Medicine [facility] Office:[phone]

## 2024-01-31 NOTE — Telephone Encounter (Signed)
 Copied from CRM [id number]. Topic: Clinical - Medication Question >> Jan 31, 2024  3:44 PM Alverda Joe S wrote: Reason for CRM: nick from [facility] pharmacy is calling because patient normally get the generic brand of the ventolin  inhaler and wants to know if it is okay to change recent prescrition to the generic because they were sent the ventolin  brand. Please call at [phone]  Dr. Dione Franks please advise if OK for pt to use generic ventolin 

## 2024-03-02 ENCOUNTER — Telehealth: Payer: Self-pay

## 2024-03-02 ENCOUNTER — Other Ambulatory Visit (HOSPITAL_COMMUNITY): Payer: Self-pay

## 2024-03-02 NOTE — Telephone Encounter (Signed)
*  Pulm  Pharmacy Patient Advocate Encounter  Received notification from [facility] that Prior Authorization for Budesonide  0.25MG /2ML suspension  has been CANCELLED due to: medication needs to be billed to Medicare Part B

## 2024-03-06 ENCOUNTER — Telehealth: Payer: Self-pay

## 2024-03-06 NOTE — Telephone Encounter (Signed)
 Medication should be covered under Medicare Part B-pharmacy will have to process under this plan.

## 2024-03-06 NOTE — Telephone Encounter (Signed)
 Copied from CRM [id number]. Topic: Clinical - Medication Prior Auth >> Mar 03, 2024 10:36 AM Hilton Lucky wrote: Reason for CRM: Patient would like an update regarding a PA for her nebulizer medication. Last note states it is cancelled due to a billing error but patient would like to pick it up.  Spoke with patient to let her know I will send a message to our PA team. Patient voice was understanding and is ok with waiting .

## 2024-03-08 MED ORDER — BUDESONIDE 0.25 MG/2ML IN SUSP: 0.2500 mg | Freq: Two times a day (BID) | RESPIRATORY_TRACT | 12 refills | Status: AC

## 2024-03-08 MED ORDER — BUDESONIDE 0.25 MG/2ML IN SUSP: 0.2500 mg | Freq: Two times a day (BID) | RESPIRATORY_TRACT | 12 refills | Status: DC

## 2024-03-08 NOTE — Addendum Note (Signed)
 Addended byRefugio Cantor M on: 03/08/2024 09:25 AM   Modules accepted: Orders

## 2024-03-08 NOTE — Telephone Encounter (Addendum)
 Sent in rx second time for Budesonide  0.25 mg/2 ml with note to bill to Medicare Part B per [facility].  Note:  notification from [facility] that Prior Authorization for Budesonide  0.25MG /2ML suspension  has been CANCELLED due to: medication needs to be billed to Medicare Part B

## 2024-03-08 NOTE — Addendum Note (Signed)
 Addended byRefugio Cantor M on: 03/08/2024 09:22 AM   Modules accepted: Orders

## 2024-03-09 ENCOUNTER — Ambulatory Visit (HOSPITAL_COMMUNITY)
Admission: RE | Admit: 2024-03-09 | Discharge: 2024-03-09 | Disposition: A | Discharge status: Home / Self Care | Payer: Medicare Other | Source: Ambulatory Visit | Attending: Cardiology | Admitting: Cardiology

## 2024-03-09 ENCOUNTER — Telehealth: Payer: Self-pay | Admitting: *Deleted

## 2024-03-09 DIAGNOSIS — I6523 Occlusion and stenosis of bilateral carotid arteries: Secondary | ICD-10-CM | POA: Diagnosis present

## 2024-03-09 NOTE — Telephone Encounter (Signed)
 Receive fax from [facility] pharmacy for PA for Budesonide  0.25 mg/2 ml suspension.  Pharmacy team please start PA.  Thank you.

## 2024-03-09 NOTE — Telephone Encounter (Signed)
 Key:  [id number]

## 2024-03-10 NOTE — Telephone Encounter (Signed)
 Must be processed under MEDICARE PART B.

## 2024-03-12 ENCOUNTER — Ambulatory Visit: Payer: Self-pay | Admitting: Cardiology

## 2024-03-12 NOTE — Progress Notes (Signed)
 Stable right ICA stenosis of around 50%, will recheck in a year.

## 2024-03-13 NOTE — Telephone Encounter (Signed)
 Called [facility] and spoke with the pharmacy and advised that our prior authorization team stated it needs to be run under medicare part B.  I was told it was requiring a diagnosis code for medicare, I provided J45.30 (Mild persist and asthma without complication).  She stated she was still getting a rejection code and went to speak with the pharmacist.  I was asked when she received her nebulizer, I told her April of 2025.  I was told it went through and would be $14.98.  Nothing further needed.

## 2024-03-20 ENCOUNTER — Ambulatory Visit: Attending: Cardiology | Admitting: Cardiology

## 2024-03-20 ENCOUNTER — Encounter: Payer: Self-pay | Admitting: Cardiology

## 2024-03-20 VITALS — BP 125/55 | HR 81 | Resp 16 | Ht 61.0 in | Wt 94.0 lb

## 2024-03-20 DIAGNOSIS — I6521 Occlusion and stenosis of right carotid artery: Secondary | ICD-10-CM | POA: Insufficient documentation

## 2024-03-20 DIAGNOSIS — R0609 Other forms of dyspnea: Secondary | ICD-10-CM | POA: Diagnosis not present

## 2024-03-20 DIAGNOSIS — I10 Essential (primary) hypertension: Secondary | ICD-10-CM | POA: Diagnosis present

## 2024-03-20 DIAGNOSIS — I351 Nonrheumatic aortic (valve) insufficiency: Secondary | ICD-10-CM | POA: Diagnosis not present

## 2024-03-20 NOTE — Progress Notes (Signed)
 " Cardiology Office Note:  .   Date:  03/21/2024  ID:  Lisa Newman, DOB 1941-03-03, MRN [id number] PCP: Royden Ronal Czar, FNP  [facility] Providers Cardiologist:  Gordy Bergamo, MD   History of Present Illness: .   Lisa Newman is a 83 y.o. 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  Patient also has had chronic dyspnea on exertion, has had a negative Lexiscan  nuclear stress test on 05/03/2023 with normal EF without ischemia and echocardiogram on 09/01/2023 revealing normal LV systolic function and mild diastolic dysfunction and mild to moderate aortic regurgitation.  She now presents for a 60-month office visit.  Discussed the use of AI scribe software for clinical note transcription with the patient, who gave verbal consent to proceed.  History of Present Illness Lisa Newman is an 83 year old female with carotid artery stenosis, aortic valve regurgitation, hypertension, and asthma who presents with shortness of breath. She follows Dr. Nikita Desai for reactivev airway disease. She experiences ongoing shortness of breath and wheezing. Inhalers have been prescribed but not yet used due to assembly requirements. Wheezing is frequent, and she anticipates improvement with inhaler use.  A carotid artery duplex was performed on March 12, 2024, with no stroke-like symptoms present. She takes a daily baby aspirin for carotid artery disease. She has aortic valve regurgitation, with no new symptoms reported since her last echocardiogram.  No symptoms are associated with low blood pressure. She takes diltiazem  180 mg and olmesartan  20 mg daily for hypertension. Occasional heart palpitations occur, described as the heart beating twice and then pausing before resuming. These palpitations are more frequent with  wheezing or shortness of breath. She remains active, mowing her yard and using a weed eater, though she experiences shortness of breath during these activities.  Labs   External Labs:  Labs 06/04/2023:  Serum glucose 88 mg, BUN 37, creatinine 1.19, EGFR 47 mL, potassium 4.5, sodium 140, LFTs normal.  Hb 14.6/HCT 46.3, platelets 262.  Total cholesterol 144, triglycerides 67, HDL 105, LDL 26.  TSH normal at 1.060.  ROS  Review of Systems  Cardiovascular:  Negative for chest pain and leg swelling.  Respiratory:  Positive for shortness of breath and wheezing.     Physical Exam:   VS:  BP (!) 125/55 (BP Location: Left Arm, Patient Position: Sitting, Cuff Size: Normal)   Pulse 81   Resp 16   Ht 5' 1 (1.549 m)   Wt 94 lb (42.6 kg)   SpO2 96%   BMI 17.76 kg/m    Wt Readings from Last 3 Encounters:  03/20/24 94 lb (42.6 kg)  01/31/24 95 lb 9.6 oz (43.4 kg)  01/04/24 96 lb 12.8 oz (43.9 kg)    Physical Exam Neck:     Vascular: No carotid bruit or JVD.   Cardiovascular:     Rate and Rhythm: Normal rate and regular rhythm.     Pulses: Intact distal pulses.     Heart sounds: Normal heart sounds. No murmur heard.    No gallop.  Pulmonary:     Effort: Pulmonary effort is normal.     Breath sounds: Wheezing (mild diffuse) present.  Abdominal:     General: Bowel sounds are normal.     Palpations: Abdomen is soft.   Musculoskeletal:     Right lower leg: No edema.  Left lower leg: No edema.    Studies Reviewed: SABRA    ECHOCARDIOGRAM COMPLETE 09/01/2023  1. Left ventricular ejection fraction, by estimation, is 60 to 65%. The left ventricle has normal function. The left ventricle has no regional wall motion abnormalities. There is mild left ventricular hypertrophy. Left ventricular diastolic parameters are consistent with Grade I diastolic dysfunction (impaired relaxation). 2. Right ventricular systolic function is normal. The right ventricular size is normal. 3. The mitral  valve is degenerative. Mild mitral valve regurgitation. 4. The aortic valve is tricuspid. Aortic valve regurgitation is mild to moderate. 5. The inferior vena cava is normal in size with greater than 50% respiratory variability, suggesting right atrial pressure of 3 mmHg.  Carotid artery duplex 03/12/2024:  Right Carotid: Velocities in the right ICA are consistent with a 40-59%  stenosis.  Left Carotid: There is no evidence of stenosis in the left ICA. Minimal wall  thickening.  Vertebrals:  Bilateral vertebral arteries demonstrate antegrade flow.  Subclavians: Normal flow hemodynamics were seen in bilateral subclavian  arteries.   EKG:    EKG Interpretation Date/Time:  Monday March 20 2024 11:07:40 EDT Ventricular Rate:  80 PR Interval:  154 QRS Duration:  124 QT Interval:  392 QTC Calculation: 452 R Axis:   61  Text Interpretation: EKG 03/20/2024: Normal sinus rhythm at rate of 80 bpm, normal axis, right bundle branch block.  Frequent PVCs in a trigeminal pattern.  Compared to 09/09/2022, no change. Confirmed by Gwendalynn Eckstrom, Jagadeesh ([id number]) on 03/20/2024 11:30:43 AM    Medications ordered    No orders of the defined types were placed in this encounter.    ASSESSMENT AND PLAN: .      ICD-10-CM   1. Asymptomatic stenosis of right carotid artery  I65.21 EKG 12-Lead    VAS US  CAROTID    2. Moderate aortic regurgitation  I35.1     3. Primary hypertension  I10     4. Dyspnea on exertion  R06.09      Assessment & Plan Carotid artery stenosis Carotid artery stenosis with 50-55% blockage on the right side. No change in symptoms or stroke-like symptoms. She is on baby aspirin and rosuvastatin , maintaining blockage stability. - Continue baby aspirin daily, with option to halve the dose. - Continue rosuvastatin  20 mg once daily. - Schedule carotid artery duplex ultrasound in one year.  Aortic valve regurgitation Mild to moderate aortic valve regurgitation with no change in heart  sounds or murmurs. No heart failure symptoms. Previous echocardiogram showed normal heart function and mild to moderate regurgitation.  Hypertension Hypertension is well controlled with diltiazem  and omisartan. Blood pressure readings are within acceptable range. No symptoms of hypotension reported. - Continue diltiazem  180 mg once daily. - Continue omisartan 20 mg once daily. - Advise weekly or biweekly blood pressure monitoring instead of daily. - Instruct to skip a dose if blood pressure is 107 mmHg and dizziness occurs.  Bronchial asthma Bronchial asthma with wheezing. Prescribed inhalers by Dr. Verdon Gore, including budesonide . Inhaler expected to improve breathing and wheezing. She plans to start using the inhaler soon. - Instruct to start using the prescribed inhaler as directed. - Advise to contact healthcare provider for assistance with inhaler setup if needed.  Signed,  Gordy Bergamo, MD, [facility] 03/21/2024, 8:26 PM [facility] [address] [place], [place] [address] Phone: [phone]. Fax:  [phone]  "

## 2024-03-20 NOTE — Patient Instructions (Addendum)
 Medication Instructions:  Your physician recommends that you continue on your current medications as directed. Please refer to the Current Medication list given to you today.  *If you need a refill on your cardiac medications before your next appointment, please call your pharmacy*  Lab Work: none If you have labs (blood work) drawn today and your tests are completely normal, you will receive your results only by: MyChart Message (if you have MyChart) OR A paper copy in the mail If you have any lab test that is abnormal or we need to change your treatment, we will call you to review the results.  Testing/Procedures: Your physician has requested that you have a carotid duplex in one year. This test is an ultrasound of the carotid arteries in your neck. It looks at blood flow through these arteries that supply the brain with blood. Allow one hour for this exam. There are no restrictions or special instructions.   Follow-Up: At [facility], you and your health needs are our priority.  As part of our continuing mission to provide you with exceptional heart care, our providers are all part of one team.  This team includes your primary Cardiologist (physician) and Advanced Practice Providers or APPs (Physician Assistants and Nurse Practitioners) who all work together to provide you with the care you need, when you need it.  Your next appointment:   12 month(s)  Provider:   Gordy Bergamo, MD    We recommend signing up for the patient portal called MyChart.  Sign up information is provided on this After Visit Summary.  MyChart is used to connect with patients for Virtual Visits (Telemedicine).  Patients are able to view lab/test results, encounter notes, upcoming appointments, etc.  Non-urgent messages can be sent to your provider as well.   To learn more about what you can do with MyChart, go to [email or web address].   Other Instructions

## 2024-03-30 DIAGNOSIS — C44611 Basal cell carcinoma of skin of unspecified upper limb, including shoulder: Secondary | ICD-10-CM | POA: Insufficient documentation

## 2024-04-20 ENCOUNTER — Emergency Department (HOSPITAL_COMMUNITY)
Admission: EM | Admit: 2024-04-20 | Discharge: 2024-04-21 | Disposition: A | Discharge status: Home / Self Care | Attending: Emergency Medicine | Admitting: Emergency Medicine

## 2024-04-20 ENCOUNTER — Other Ambulatory Visit: Payer: Self-pay

## 2024-04-20 DIAGNOSIS — Z79899 Other long term (current) drug therapy: Secondary | ICD-10-CM | POA: Diagnosis not present

## 2024-04-20 DIAGNOSIS — Z7982 Long term (current) use of aspirin: Secondary | ICD-10-CM | POA: Insufficient documentation

## 2024-04-20 DIAGNOSIS — I10 Essential (primary) hypertension: Secondary | ICD-10-CM | POA: Diagnosis not present

## 2024-04-20 DIAGNOSIS — R519 Headache, unspecified: Secondary | ICD-10-CM | POA: Diagnosis present

## 2024-04-20 DIAGNOSIS — Z85858 Personal history of malignant neoplasm of other endocrine glands: Secondary | ICD-10-CM | POA: Diagnosis not present

## 2024-04-20 DIAGNOSIS — R030 Elevated blood-pressure reading, without diagnosis of hypertension: Secondary | ICD-10-CM

## 2024-04-20 NOTE — ED Triage Notes (Signed)
 Pt BIB [facility] from home. Pt c/o exertion causing thumping headache related to elevated BP. Initial bp 220/9  Pt pressure 168/70 with em[facility]. NSR Hx HTN 80HR  198/76 97% RA 92CBG 18G LAC

## 2024-04-21 ENCOUNTER — Emergency Department (HOSPITAL_COMMUNITY)

## 2024-04-21 DIAGNOSIS — I10 Essential (primary) hypertension: Secondary | ICD-10-CM | POA: Diagnosis not present

## 2024-04-21 LAB — CBC WITH DIFFERENTIAL/PLATELET
Abs Immature Granulocytes: 0.02 K/uL (ref 0.00–0.07)
Basophils Absolute: 0 K/uL (ref 0.0–0.1)
Basophils Relative: 1 %
Eosinophils Absolute: 0.1 K/uL (ref 0.0–0.5)
Eosinophils Relative: 1 %
HCT: 43.5 % (ref 36.0–46.0)
Hemoglobin: 13.3 g/dL (ref 12.0–15.0)
Immature Granulocytes: 0 %
Lymphocytes Relative: 15 %
Lymphs Abs: 0.9 K/uL (ref 0.7–4.0)
MCH: 29.8 pg (ref 26.0–34.0)
MCHC: 30.6 g/dL (ref 30.0–36.0)
MCV: 97.3 fL (ref 80.0–100.0)
Monocytes Absolute: 0.5 K/uL (ref 0.1–1.0)
Monocytes Relative: 8 %
Neutro Abs: 4.8 K/uL (ref 1.7–7.7)
Neutrophils Relative %: 75 %
Platelets: 231 K/uL (ref 150–400)
RBC: 4.47 MIL/uL (ref 3.87–5.11)
RDW: 13.2 % (ref 11.5–15.5)
WBC: 6.3 K/uL (ref 4.0–10.5)
nRBC: 0 % (ref 0.0–0.2)

## 2024-04-21 LAB — URINALYSIS, ROUTINE W REFLEX MICROSCOPIC
Bacteria, UA: NONE SEEN
Bilirubin Urine: NEGATIVE
Glucose, UA: NEGATIVE mg/dL
Hgb urine dipstick: NEGATIVE
Ketones, ur: NEGATIVE mg/dL
Leukocytes,Ua: NEGATIVE
Nitrite: NEGATIVE
Protein, ur: NEGATIVE mg/dL
Specific Gravity, Urine: 1.002 — ABNORMAL LOW (ref 1.005–1.030)
pH: 6 (ref 5.0–8.0)

## 2024-04-21 LAB — BASIC METABOLIC PANEL WITH GFR
Anion gap: 9 (ref 5–15)
BUN: 37 mg/dL — ABNORMAL HIGH (ref 8–23)
CO2: 26 mmol/L (ref 22–32)
Calcium: 9.8 mg/dL (ref 8.9–10.3)
Chloride: 107 mmol/L (ref 98–111)
Creatinine, Ser: 1.07 mg/dL — ABNORMAL HIGH (ref 0.44–1.00)
GFR, Estimated: 53 mL/min — ABNORMAL LOW (ref >60)
Glucose, Bld: 122 mg/dL — ABNORMAL HIGH (ref 70–99)
Potassium: 4.3 mmol/L (ref 3.5–5.1)
Sodium: 142 mmol/L (ref 135–145)

## 2024-04-21 LAB — TROPONIN I (HIGH SENSITIVITY): Troponin I (High Sensitivity): 11 ng/L (ref <18)

## 2024-04-21 MED ORDER — ACETAMINOPHEN 325 MG PO TABS
650.0000 mg | ORAL_TABLET | Freq: Once | ORAL | Status: AC
  Administered 2024-04-21: 650 mg via ORAL
  Filled 2024-04-21: qty 2

## 2024-04-21 MED ORDER — SODIUM CHLORIDE 0.9 % IV BOLUS
500.0000 mL | Freq: Once | INTRAVENOUS | Status: AC
  Administered 2024-04-21: 500 mL via INTRAVENOUS

## 2024-04-21 NOTE — ED Provider Notes (Signed)
 [facility] EMERGENCY DEPARTMENT AT [facility] Provider Note   CSN: [id number] Arrival date & time: 04/20/24  2323     Patient presents with: Hypertension   Lisa Newman is a 83 y.o. 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  Patient reports that while she was eating dinner around 8084 today she felt a throbbing sensation near her left temple.  Denies any pain but felt a throbbing/pulsating sensation.  It was intermittent.  Was present without chewing.  Was not having any jaw pain.  She reports has been intermittent since then.  Took her blood pressure was elevated in the 190s systolically.  She does take her blood pressure medication around 1930 every night.  She denies any fever, blurry vision, nausea, vomiting, lightheadedness, dizziness, chest pain, shortness of breath, palpitations, numbness, tingling, trouble walking, trouble talking, or any weakness.  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  She reports compliancy with her medications.  She has not had any recurrence of this sensation since being in the emergency department.  She reports that today she was mowing the lawn over she did not feel dehydrated nor did she felt she was overexerting herself per patient.  Allergic to ibuprofen.  Denies any tobacco, EtOH or illicit drug use.   Hypertension Associated symptoms include headaches. Pertinent negatives include no chest pain, no abdominal pain and no shortness of breath.       Prior to Admission medications   Medication Sig Start Date End Date Taking? Authorizing Provider  albuterol  (PROVENTIL ) (2.5 MG/3ML) 0.083% nebulizer  solution Take 3 mLs (2.5 mg total) by nebulization every 6 (six) hours as needed for wheezing or shortness of breath. 01/04/24   Desai, Nikita S, MD  albuterol  (VENTOLIN  HFA) 108 [phone] Base) MCG/ACT inhaler Inhale 2 puffs into the lungs every 6 (six) hours as needed for wheezing or shortness of breath. 01/31/24   Desai, Nikita S, MD  aspirin 81 MG tablet Take 81 mg by mouth daily.      [provider]  budesonide  (PULMICORT ) 0.25 MG/2ML nebulizer solution Take 2 mLs (0.25 mg total) by nebulization in the morning and at bedtime. 03/08/24   Byrum, Robert S, MD  Calcium  Carbonate-Vitamin D (CALTRATE 600+D PO) Take 1 tablet by mouth daily.     [provider]  Cholecalciferol (VITAMIN D PO) Take 1 tablet by mouth daily.     [provider]  diltiazem  (CARDIZEM  CD) 180 MG 24 hr capsule TAKE 1 CAPSULE (180 MG TOTAL) BY MOUTH DAILY. PLEASE OBTAIN REFILLS FROM PCP 10/20/21   Ladona Heinz, MD  [facility]) See admin instructions.    [provider]  fexofenadine (ALLEGRA) 180 MG tablet Take 180 mg by mouth as needed for allergies.     [provider]  fluticasone (FLONASE ALLERGY RELIEF) 50 MCG/ACT nasal spray Place 2 sprays into both nostrils daily.    [provider]  Multiple Vitamin (MULTIVITAMIN PO) Take 1 tablet by mouth. occas    [provider]  Multiple Vitamins-Minerals (AIRBORNE) TBEF Take 1 tablet by mouth as needed.    [provider]  olmesartan  (BENICAR ) 20 MG tablet Take 1 tablet (20 mg total) by mouth  daily. 08/13/22   Osa Grate, NP  pantoprazole (PROTONIX) 40 MG tablet Take 40 mg by mouth as needed. 07/03/21   [provider]  rosuvastatin  (CRESTOR ) 20 MG tablet TAKE 1 TABLET(20 MG) BY MOUTH DAILY 09/23/23   Ladona Heinz, MD    Allergies: Ibuprofen    Review of Systems  Constitutional:  Negative for chills and fever.  HENT:  Negative for congestion and rhinorrhea.   Eyes:  Negative for visual disturbance.   Respiratory:  Negative for shortness of breath.   Cardiovascular:  Negative for chest pain and palpitations.  Gastrointestinal:  Negative for abdominal pain, constipation, diarrhea, nausea and vomiting.  Genitourinary:  Negative for dysuria.  Musculoskeletal:  Negative for gait problem.  Neurological:  Positive for headaches. Negative for dizziness, syncope, speech difficulty, weakness, light-headedness and numbness.    Updated Vital Signs BP (!) 178/62   Pulse 74   Temp 98.3 F (36.8 C) (Oral)   Resp 17   SpO2 96%   Physical Exam Vitals and nursing note reviewed.  Constitutional:      General: She is not in acute distress.    Appearance: She is not toxic-appearing.  HENT:     Mouth/Throat:     Mouth: Mucous membranes are moist.  Eyes:     General: No visual field deficit or scleral icterus.    Extraocular Movements: Extraocular movements intact.     Pupils: Pupils are equal, round, and reactive to light.  Neck:     Comments: No tenderness to the anterior, lateral, or posterior neck.  Cardiovascular:     Rate and Rhythm: Normal rate.     Pulses:          Radial pulses are 2+ on the right side and 2+ on the left side.       Dorsalis pedis pulses are 2+ on the right side and 2+ on the left side.       Posterior tibial pulses are 2+ on the right side and 2+ on the left side.  Pulmonary:     Effort: Pulmonary effort is normal. No respiratory distress.  Musculoskeletal:     Cervical back: Normal range of motion. No tenderness.  Skin:    General: Skin is warm and dry.  Neurological:     Mental Status: She is alert.     Cranial Nerves: Facial asymmetry (at baseline) present. No dysarthria.     Motor: No weakness or pronator drift.     Coordination: Finger-Nose-Finger Test normal.     Gait: Gait is intact.     Comments: Patient has chronic right sided facial droop status post radiation, otherwise no other acute cranial nerve deficit appreciated.  She is answering questions  appropriately appropriate speech.  PERRLA.  EOMI.  Does have numbness on the right side of the face, again at baseline.  Strength is 5 out of 5 in upper and lower extremities.  Normal finger-nose-finger with bilateral arms.  No pronator drift.  Visual fields are intact with monocular and binocular vision.     (all labs ordered are listed, but only abnormal results are displayed) Labs Reviewed  BASIC METABOLIC PANEL WITH GFR - Abnormal; Notable for the following components:      Result Value   Glucose, Bld 122 (*)    BUN 37 (*)    Creatinine, Ser 1.07 (*)    GFR, Estimated 53 (*)    All other components within normal limits  URINALYSIS, ROUTINE W REFLEX MICROSCOPIC - Abnormal; Notable  for the following components:   Color, Urine COLORLESS (*)    Specific Gravity, Urine 1.002 (*)    All other components within normal limits  CBC WITH DIFFERENTIAL/PLATELET  TROPONIN I (HIGH SENSITIVITY)  TROPONIN I (HIGH SENSITIVITY)    EKG: EKG Interpretation Date/Time:  Friday April 21 2024 00:27:32 EDT Ventricular Rate:  71 PR Interval:  159 QRS Duration:  138 QT Interval:  449 QTC Calculation: 488 R Axis:   81  Text Interpretation: Sinus rhythm Right bundle branch block Confirmed by Griselda Norris [id number]) on 04/21/2024 12:33:47 AM  Radiology: CT Head Wo Contrast Result Date: 04/21/2024 EXAM: CT HEAD WITHOUT CONTRAST 04/21/2024 02:13:00 AM TECHNIQUE: CT of the head was performed without the administration of intravenous contrast. Automated exposure control, iterative reconstruction, and/or weight based adjustment of the mA/kV was utilized to reduce the radiation dose to as low as reasonably achievable. COMPARISON: 08/28/2019 CLINICAL HISTORY: Headache, new onset (Age >= 51y). FINDINGS: BRAIN AND VENTRICLES: No acute hemorrhage. Gray-white differentiation is preserved. No hydrocephalus. No extra-axial collection. No mass effect or midline shift. Global cortical atrophy. Subcortical and  periventricular small vessel ischemic changes. Intracranial atherosclerosis. ORBITS: No acute abnormality. SINUSES: No acute abnormality. SOFT TISSUES AND SKULL: No acute soft tissue abnormality. No skull fracture. IMPRESSION: 1. No acute intracranial abnormality. 2. Atrophy with small vessel ischemic changes. Electronically signed by: Pinkie Pebbles MD 04/21/2024 02:18 AM EDT RP Workstation: [id number]    Procedures   Medications Ordered in the ED  sodium chloride  0.9 % bolus 500 mL (500 mLs Intravenous New Bag/Given 04/21/24 0134)  acetaminophen (TYLENOL) tablet 650 mg (650 mg Oral Given 04/21/24 0134)                                Medical Decision Making Amount and/or Complexity of Data Reviewed Labs: ordered. Radiology: ordered.  Risk OTC drugs.   83 y.o. female presents to the ER for evaluation of throbbing intermittent sensation over left temple with elevated BP. Differential diagnosis includes but is not limited to Stroke, increased ICP, meningitis, CVA, intracranial tumor, venous sinus thrombosis, migraine, cluster headache, hypertension, drug related, head injury, tension headache, sinusitis, dental abscess, otitis media, TMJ, temporal arteritis. Vital signs elevated blood pressure at 178/62 otherwise unremarkable. Physical exam as noted above.   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  Denies any acute changes.  She has not felt any of the throbbing station or left temple since arrival to the emergency department from the fire department.  She reports compliance with her blood pressure medication.  She denies any new visual changes.  She denies that the throbbing/pulsating sensation came with any pain.  My attending's has assessed the patient at bedside.  Recommends CT noncontrast.  Agrees with labs. Will order small bolus of IVF and Tylenol  I independently reviewed and interpreted the patient's labs.  Urinalysis  shows colorless dilute urine otherwise unremarkable.  CBC without leukocytosis or anemia.  Troponin 11.  BMP shows glucose at 122, BUN of 37 with a creatinine of 1.07.  Unfortunately I do not have any recent labs. BUN may be related to dehydration.  CT head shows 1. No acute intracranial abnormality. 2. Atrophy with small vessel ischemic changes. Per radiologist's interpretation.    Blood pressure is improved and is 141/58 and 139/60 without any antihypertensive treatment.  Unsure of what was causing the intermittent throbbing sensation.  Again, patient reports it was not painful.  My attending does not think this is any GCA.  Does not think any other vascular imaging is needed.  Given that patient's blood pressure has improved, as well as patient has not had any recurrence of the throbbing sensation, she is stable for discharge home.  We discussed the results of the labs/imaging. The plan is monitor blood pressure, follow-up PCP. We discussed strict return precautions and red flag symptoms. The patient verbalized their understanding and agrees to the plan. The patient is stable and being discharged home in good condition.  I discussed this case with my attending physician who cosigned this note including patient's presenting symptoms, physical exam, and planned diagnostics and interventions. Attending physician stated agreement with plan or made changes to plan which were implemented.   Attending physician assessed patient at bedside.  Portions of this report may have been transcribed using voice recognition software. Every effort was made to ensure accuracy; however, inadvertent computerized transcription errors may be present.    Final diagnoses:  Elevated blood pressure reading    ED Discharge Orders     None          Bernis Ernst, [place] 04/21/24 0439    Griselda Norris, MD 04/21/24 [phone]

## 2024-04-21 NOTE — Discharge Instructions (Addendum)
 You were seen in the ER today for evaluation of your symptoms. You work up was unremarkable. Your blood pressure is within normal limits now. I have included a blood pressure log for you to bring to your next appointment. Please call to schedule a follow up appointment! Additionally, I have included more information for you to review in the discharge paperwork. If you have any concerns, new or worsening sensation, please return to the nearest ER for re-evaluation.   Contact a health care provider if you: Think you are having a reaction to a medicine you are taking. Have headaches that keep coming back (recurring). Feel dizzy. Have swelling in your ankles. Have trouble with your vision. Get help right away if you: Develop a severe headache or confusion. Have unusual weakness or numbness. Feel faint. Have severe pain in your chest or abdomen. Vomit repeatedly. Have trouble breathing. These symptoms may be an emergency. Get help right away. Call 911. Do not wait to see if the symptoms will go away. Do not drive yourself to the hospital.

## 2024-05-11 DIAGNOSIS — E042 Nontoxic multinodular goiter: Secondary | ICD-10-CM | POA: Insufficient documentation

## 2024-05-11 DIAGNOSIS — R09A2 Foreign body sensation, throat: Secondary | ICD-10-CM | POA: Insufficient documentation

## 2024-05-11 DIAGNOSIS — C44222 Squamous cell carcinoma of skin of right ear and external auricular canal: Secondary | ICD-10-CM | POA: Insufficient documentation

## 2024-05-22 ENCOUNTER — Ambulatory Visit
Admission: RE | Admit: 2024-05-22 | Discharge: 2024-05-22 | Disposition: A | Discharge status: Home / Self Care | Source: Ambulatory Visit | Attending: Physician Assistant | Admitting: Physician Assistant

## 2024-05-22 ENCOUNTER — Other Ambulatory Visit: Payer: Self-pay | Admitting: Physician Assistant

## 2024-05-22 DIAGNOSIS — E042 Nontoxic multinodular goiter: Secondary | ICD-10-CM

## 2024-06-01 HISTORY — PX: SQUAMOUS CELL CARCINOMA EXCISION: SHX2433

## 2024-06-08 LAB — LAB REPORT - SCANNED: EGFR: 51

## 2024-07-21 ENCOUNTER — Other Ambulatory Visit: Payer: Self-pay | Admitting: Cardiology

## 2024-07-21 DIAGNOSIS — E7800 Pure hypercholesterolemia, unspecified: Secondary | ICD-10-CM

## 2024-08-03 ENCOUNTER — Ambulatory Visit: Admission: EM | Admit: 2024-08-03 | Discharge: 2024-08-03 | Disposition: A | Discharge status: Home / Self Care

## 2024-08-03 ENCOUNTER — Encounter: Payer: Self-pay | Admitting: Emergency Medicine

## 2024-08-03 ENCOUNTER — Ambulatory Visit

## 2024-08-03 DIAGNOSIS — R0902 Hypoxemia: Secondary | ICD-10-CM | POA: Diagnosis not present

## 2024-08-03 DIAGNOSIS — K2100 Gastro-esophageal reflux disease with esophagitis, without bleeding: Secondary | ICD-10-CM | POA: Insufficient documentation

## 2024-08-03 DIAGNOSIS — R159 Full incontinence of feces: Secondary | ICD-10-CM | POA: Insufficient documentation

## 2024-08-03 DIAGNOSIS — N1831 Chronic kidney disease, stage 3a: Secondary | ICD-10-CM | POA: Insufficient documentation

## 2024-08-03 DIAGNOSIS — L290 Pruritus ani: Secondary | ICD-10-CM | POA: Insufficient documentation

## 2024-08-03 DIAGNOSIS — Z1211 Encounter for screening for malignant neoplasm of colon: Secondary | ICD-10-CM | POA: Insufficient documentation

## 2024-08-03 DIAGNOSIS — R1033 Periumbilical pain: Secondary | ICD-10-CM | POA: Insufficient documentation

## 2024-08-03 DIAGNOSIS — Z0000 Encounter for general adult medical examination without abnormal findings: Secondary | ICD-10-CM | POA: Insufficient documentation

## 2024-08-03 DIAGNOSIS — I6523 Occlusion and stenosis of bilateral carotid arteries: Secondary | ICD-10-CM | POA: Insufficient documentation

## 2024-08-03 DIAGNOSIS — R432 Parageusia: Secondary | ICD-10-CM | POA: Insufficient documentation

## 2024-08-03 DIAGNOSIS — K529 Noninfective gastroenteritis and colitis, unspecified: Secondary | ICD-10-CM | POA: Diagnosis not present

## 2024-08-03 DIAGNOSIS — R609 Edema, unspecified: Secondary | ICD-10-CM | POA: Insufficient documentation

## 2024-08-03 DIAGNOSIS — F411 Generalized anxiety disorder: Secondary | ICD-10-CM | POA: Insufficient documentation

## 2024-08-03 DIAGNOSIS — R1310 Dysphagia, unspecified: Secondary | ICD-10-CM | POA: Insufficient documentation

## 2024-08-03 DIAGNOSIS — K219 Gastro-esophageal reflux disease without esophagitis: Secondary | ICD-10-CM | POA: Insufficient documentation

## 2024-08-03 DIAGNOSIS — R195 Other fecal abnormalities: Secondary | ICD-10-CM | POA: Insufficient documentation

## 2024-08-03 DIAGNOSIS — Z9109 Other allergy status, other than to drugs and biological substances: Secondary | ICD-10-CM | POA: Insufficient documentation

## 2024-08-03 DIAGNOSIS — F419 Anxiety disorder, unspecified: Secondary | ICD-10-CM | POA: Insufficient documentation

## 2024-08-03 DIAGNOSIS — I70203 Unspecified atherosclerosis of native arteries of extremities, bilateral legs: Secondary | ICD-10-CM | POA: Insufficient documentation

## 2024-08-03 DIAGNOSIS — M810 Age-related osteoporosis without current pathological fracture: Secondary | ICD-10-CM | POA: Insufficient documentation

## 2024-08-03 DIAGNOSIS — N1832 Chronic kidney disease, stage 3b: Secondary | ICD-10-CM | POA: Insufficient documentation

## 2024-08-03 DIAGNOSIS — M2010 Hallux valgus (acquired), unspecified foot: Secondary | ICD-10-CM | POA: Insufficient documentation

## 2024-08-03 DIAGNOSIS — M19079 Primary osteoarthritis, unspecified ankle and foot: Secondary | ICD-10-CM | POA: Insufficient documentation

## 2024-08-03 DIAGNOSIS — Z780 Asymptomatic menopausal state: Secondary | ICD-10-CM | POA: Insufficient documentation

## 2024-08-03 DIAGNOSIS — E039 Hypothyroidism, unspecified: Secondary | ICD-10-CM | POA: Insufficient documentation

## 2024-08-03 DIAGNOSIS — K5900 Constipation, unspecified: Secondary | ICD-10-CM | POA: Insufficient documentation

## 2024-08-03 DIAGNOSIS — Q666 Other congenital valgus deformities of feet: Secondary | ICD-10-CM | POA: Insufficient documentation

## 2024-08-03 DIAGNOSIS — R1111 Vomiting without nausea: Secondary | ICD-10-CM | POA: Insufficient documentation

## 2024-08-03 DIAGNOSIS — E559 Vitamin D deficiency, unspecified: Secondary | ICD-10-CM | POA: Insufficient documentation

## 2024-08-03 LAB — POCT URINE DIPSTICK
Bilirubin, UA: NEGATIVE
Blood, UA: NEGATIVE
Glucose, UA: NEGATIVE mg/dL
Leukocytes, UA: NEGATIVE
Nitrite, UA: NEGATIVE
POC PROTEIN,UA: 30 — AB
Spec Grav, UA: 1.015 (ref 1.010–1.025)
Urobilinogen, UA: 0.2 U/dL
pH, UA: 5.5 (ref 5.0–8.0)

## 2024-08-03 MED ORDER — ONDANSETRON 8 MG PO TBDP: 8.0000 mg | ORAL_TABLET | Freq: Three times a day (TID) | ORAL | 0 refills | Status: AC | PRN

## 2024-08-03 MED ORDER — ONDANSETRON 4 MG PO TBDP
4.0000 mg | ORAL_TABLET | Freq: Once | ORAL | Status: AC
  Administered 2024-08-03: 4 mg via ORAL

## 2024-08-03 NOTE — ED Triage Notes (Signed)
 Pt presents c/o abd pain, diarrhea, and nausea x 8 days. Pt states,  Last Tuesday night after I ate supper I flt like I had to go to the bathroom. After that I started having the diarrhea all night long. That went on, off and on til Friday night, I took Imodium and Pepto. I didn't throw up but I wish I had. It just left me feeling tired and weak. My stomach still feels nauseated and I still don't feel so good. I was tested for COVID and Flu at the drug store and they were both negative. I ain't had a temperature or noting. I just.. I can't eat a lot. I ate some solid food yesterday at lunch but felt nauseated last night so for dinner I just had some crackers and chicken broth. I think it's a stomach virus and I don't know how long it last.

## 2024-08-03 NOTE — Discharge Instructions (Signed)
 Preliminary review of the chest x-ray appears to be normal.  If radiologist report something different we will call you.  You have been diagnosed with a gastrointestinal issue today with symptoms of nausea, vomiting, and/or diarrhea.  It is best that you eat a bland diet which includes dry toast, applesauce, bananas, chicken broth, plain rice, or plain mashed potatoes eat these things with no salt-and-pepper, excessive butter, or other seasonings.  It is also important to drink plenty of fluids as you are losing a lot of electrolytes due to vomiting and having diarrhea.  Diluted Gatorade, water, Pedialyte, liquid IV, etc. are good choices for fluid intake.  You may also use popsicles and/or Italian ice.  It is recommended that you do not use anything to stop your diarrhea as it is your body's way of getting rid of the offending bug.  If your symptoms are not improving within 3 to 5 days, you are experiencing significant fatigue, or you are urinating less frequently you will need to follow-up with your PCP or report to the ER.

## 2024-08-03 NOTE — ED Provider Notes (Signed)
 [facility]    CSN: [id number] Arrival date & time: 08/03/24  0936      History   Chief Complaint Chief Complaint  Patient presents with   Abdominal Pain   Fatigue    HPI Lisa Newman is a 83 y.o. female.   Pt with a hx of COPD and Asthma, presents today due to 8 days of nausea, periumbilical abdominal pain, and diarrhea. Pt states that at the time of this visit her diarrhea has stopped and she is having formed stools. Pt states that she has been eating a bland diet but has tried to eat a heavier diet and is experiencing nausea. Pt denies dizziness or shortness of breath but in triage her pulse ox was found to be in the 60s, she has a hx of Raynaud's and her hands and tips of fingers are cold to touch but patient does not appear to be in acute distress. Pt states that she has been using pepto bismol and immodium AD for her diarrhea. Pt states that diarrhea lasted from last Tues to last Friday. Pt cannot quantify how many episodes of diarrhea she had but does state she is experiencing nausea.   The history is provided by the patient.  Abdominal Pain   Past Medical History:  Diagnosis Date   Arthritis    Asthma    Carcinoma (HCC)    parotid '96- surgery, chemothrapy, radiation -last surgery 2'96([facility])   Carcinoma of parotid gland (HCC)    Constipation    COPD (chronic obstructive pulmonary disease) (HCC)    carries Proair  Inhaler-has not used   Emphysema/COPD    Fall at home    01-04-15 a week ago, loss balance after tripped soreness generalized around hip area.   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   Encounter for screening for malignant neoplasm of colon 08/03/2024   Medicare annual wellness visit, subsequent 08/03/2024   Constipation 08/03/2024   Dysphagia 08/03/2024   Edema 08/03/2024   Fecal incontinence 08/03/2024   Gastroesophageal reflux disease 08/03/2024   GERD without esophagitis 08/03/2024   Gastroesophageal reflux disease with esophagitis 08/03/2024   Generalized anxiety disorder 08/03/2024   Hypothyroidism 08/03/2024   Loss of taste 08/03/2024   Occlusion and stenosis of bilateral carotid arteries 08/03/2024   Periumbilical pain 08/03/2024   Postmenopausal 08/03/2024   Primary localized osteoarthrosis of ankle and foot 08/03/2024   Pruritus ani 08/03/2024   Stage 3a chronic kidney disease (HCC) 08/03/2024   Stage 3b chronic kidney disease (HCC) 08/03/2024   Vitamin D deficiency 08/03/2024   Vomiting without nausea 08/03/2024   Globus sensation 05/11/2024   Multiple thyroid  nodules 05/11/2024   Squamous cell carcinoma, ear, 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   Basal cell carcinoma of upper extremity 03/30/2024   Bilateral impacted cerumen 03/17/2023   Ear pain, left 03/17/2023   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   Postprocedural stenosis of  right external ear canal 01/19/2023   Itching of ear 06/05/2021   Hearing loss 05/22/2021   Facial paralysis on right side 12/17/2020   Ceruminosis, right 07/31/2020   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   Stenosis of right carotid artery 04/14/2019   Aortic regurgitation 04/14/2019   Essential hypertension 04/14/2019   Hypercholesterolemia 04/14/2019   Compression fracture of thoracic vertebra (HCC) 04/11/2013   History of parotid cancer 05/19/2012   Acquired stenosis of external ear canal secondary  to surgery 09/17/2011   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      Home Medications    Prior to Admission medications   Medication Sig Start Date End Date Taking? Authorizing Provider  diltiazem  (CARDIZEM  CD) 180 MG 24 hr capsule TAKE 1 CAPSULE (180 MG TOTAL) BY MOUTH DAILY. PLEASE OBTAIN REFILLS FROM PCP 10/20/21  Yes Ladona Heinz, MD  doxycycline (VIBRAMYCIN) 100 MG capsule Take 100 mg by mouth 2 (two) times daily. 06/01/24  Yes [provider]  hydrOXYzine (ATARAX) 25 MG tablet 1 tablet as needed Orally Once a day 04/26/24  Yes [provider]  ondansetron (ZOFRAN-ODT) 8 MG disintegrating tablet Take 1 tablet (8 mg total) by mouth every 8 (eight) hours as needed for nausea or vomiting. 08/03/24  Yes Andra Corean BROCKS, PA-C  predniSONE (DELTASONE) 5 MG tablet as directed Orally daily taper 6,5,4,3,2,1; Duration: 6 days 09/15/23  Yes [provider]  albuterol  (PROVENTIL ) (2.5 MG/3ML) 0.083% nebulizer solution Take 3 mLs (2.5 mg total) by nebulization every 6 (six) hours as needed for wheezing or shortness of breath. 01/04/24   Meade Verdon RAMAN, MD  albuterol  (VENTOLIN  HFA) 108 [phone] Base) MCG/ACT inhaler Inhale 2 puffs into the lungs every 6 (six) hours as needed for wheezing or shortness of breath. 01/31/24   Desai, Nikita S, MD  aspirin 81 MG tablet Take 81 mg by mouth daily.      [provider]   budesonide  (PULMICORT ) 0.25 MG/2ML nebulizer solution Take 2 mLs (0.25 mg total) by nebulization in the morning and at bedtime. 03/08/24   Shelah Lamar RAMAN, MD  Calcium  Carbonate-Vitamin D (CALTRATE 600+D PO) Take 1 tablet by mouth daily.     [provider]  Cholecalciferol (VITAMIN D PO) Take 1 tablet by mouth daily.     [provider]  cyanocobalamin 100 MCG tablet 1 tablet Orally Once a day    [provider]  Ensure (ENSURE) See admin instructions.    [provider]  fexofenadine (ALLEGRA) 180 MG tablet Take 180 mg by mouth as needed for allergies.     [provider]  fluticasone (FLONASE ALLERGY RELIEF) 50 MCG/ACT nasal spray Place 2 sprays into both nostrils daily.    [provider]  Multiple Vitamin (MULTIVITAMIN PO) Take 1 tablet by mouth. occas    [provider]  Multiple Vitamins-Minerals (AIRBORNE) TBEF  Take 1 tablet by mouth as needed.    [provider]  olmesartan  (BENICAR ) 20 MG tablet Take 1 tablet (20 mg total) by mouth daily. 08/13/22   Osa Grate, NP  pantoprazole (PROTONIX) 40 MG tablet Take 40 mg by mouth as needed. 07/03/21   [provider]  rosuvastatin  (CRESTOR ) 20 MG tablet TAKE 1 TABLET(20 MG) BY MOUTH DAILY 07/21/24   Ladona Heinz, MD  sodium chloride  (NASAL MOIST) 0.65 % nasal spray 2 drops in each nostril as needed Nasally as needed    [provider]    Family History Family History  Problem Relation Age of Onset   Heart disease Brother        heart attack   Cancer Paternal Aunt        pt unaware of what kind    Social History Social History   Tobacco Use   Smoking status: Never    Passive exposure: Past   Smokeless tobacco: Never   Tobacco comments:    second hand smoke exposure  Vaping Use   Vaping status: Never Used  Substance Use Topics   Alcohol use: No   Drug use: No     Allergies   Ibuprofen   Review of Systems Review of Systems   Gastrointestinal:  Positive for abdominal pain.     Physical Exam Triage Vital Signs ED Triage Vitals  Encounter Vitals Group     BP 08/03/24 1000 (!) 144/63     Girls Systolic BP Percentile --      Girls Diastolic BP Percentile --      Boys Systolic BP Percentile --      Boys Diastolic BP Percentile --      Pulse Rate 08/03/24 1000 (!) 46     Resp 08/03/24 1000 20     Temp 08/03/24 1000 97.9 F (36.6 C)     Temp Source 08/03/24 1000 Oral     SpO2 08/03/24 1000 (!) 66 %     Weight 08/03/24 0958 93 lb 14.7 oz (42.6 kg)     Height --      Head Circumference --      Peak Flow --      Pain Score 08/03/24 0956 0     Pain Loc --      Pain Education --      Exclude from Growth Chart --    No data found.  Updated Vital Signs BP (!) 144/63 (BP Location: Left Arm)   Pulse 86   Temp 97.9 F (36.6 C) (Oral)   Resp 20   Wt 93 lb 14.7 oz (42.6 kg)   SpO2 (!) 88%   BMI 17.75 kg/m   Visual Acuity Right Eye Distance:   Left Eye Distance:   Bilateral Distance:    Right Eye Near:   Left Eye Near:    Bilateral Near:     Physical Exam Vitals and nursing note reviewed.  Constitutional:      General: She is not in acute distress.    Appearance: Normal appearance. She is not ill-appearing, toxic-appearing or diaphoretic.  Eyes:     General: No scleral icterus. Cardiovascular:     Rate and Rhythm: Normal rate and regular rhythm.     Heart sounds: Normal heart sounds.  Pulmonary:     Effort: Pulmonary effort is normal. No respiratory distress.     Breath sounds: Rhonchi (very mild, lung bases) present. No wheezing.  Abdominal:     General: Abdomen is  flat. Bowel sounds are normal.     Palpations: Abdomen is soft.     Tenderness: There is abdominal tenderness in the periumbilical area. There is no right CVA tenderness or left CVA tenderness.  Skin:    General: Skin is warm.  Neurological:     Mental Status: She is alert and oriented to person, place, and time.   Psychiatric:        Mood and Affect: Mood normal.        Behavior: Behavior normal.      UC Treatments / Results  Labs (all labs ordered are listed, but only abnormal results are displayed) Labs Reviewed  POCT URINE DIPSTICK - Abnormal; Notable for the following components:      Result Value   Ketones, POC UA trace (5) (*)    POC PROTEIN,UA =30 (*)    All other components within normal limits    EKG   Radiology No results found.  Procedures Procedures (including critical care time)  Medications Ordered in UC Medications  ondansetron (ZOFRAN-ODT) disintegrating tablet 4 mg (4 mg Oral Given 08/03/24 1104)    Initial Impression / Assessment and Plan / UC Course  I have reviewed the triage vital signs and the nursing notes.  Pertinent labs & imaging results that were available during my care of the patient were reviewed by me and considered in my medical decision making (see chart for details).  Clinical Course as of 08/03/24 1152  Thu Aug 03, 2024  1015 71 pulse ox [SP]  1015 99% on 3L of o2 [SP]    Clinical Course User Index [SP] Andra Corean BROCKS, PA-C   Abnormal pulse ox most likely due to Raynaud's patient reports that other clinics usually skip pulse ox due to having trouble obtaining it. Pt is in now acute distress. Final Clinical Impressions(s) / UC Diagnoses   Final diagnoses:  Hypoxia  Acute gastroenteritis     Discharge Instructions      Preliminary review of the chest x-ray appears to be normal.  If radiologist report something different we will call you.  You have been diagnosed with a gastrointestinal issue today with symptoms of nausea, vomiting, and/or diarrhea.  It is best that you eat a bland diet which includes dry toast, applesauce, bananas, chicken broth, plain rice, or plain mashed potatoes eat these things with no salt-and-pepper, excessive butter, or other seasonings.  It is also important to drink plenty of fluids as you are losing  a lot of electrolytes due to vomiting and having diarrhea.  Diluted Gatorade, water, Pedialyte, liquid IV, etc. are good choices for fluid intake.  You may also use popsicles and/or Italian ice.  It is recommended that you do not use anything to stop your diarrhea as it is your body's way of getting rid of the offending bug.  If your symptoms are not improving within 3 to 5 days, you are experiencing significant fatigue, or you are urinating less frequently you will need to follow-up with your PCP or report to the ER.      ED Prescriptions     Medication Sig Dispense Auth. Provider   ondansetron (ZOFRAN-ODT) 8 MG disintegrating tablet Take 1 tablet (8 mg total) by mouth every 8 (eight) hours as needed for nausea or vomiting. 21 tablet Andra Corean BROCKS, PA-C      PDMP not reviewed this encounter.   Andra Corean BROCKS, PA-C 08/03/24 1153

## 2024-08-07 ENCOUNTER — Ambulatory Visit (HOSPITAL_COMMUNITY): Payer: Self-pay

## 2024-10-06 ENCOUNTER — Telehealth: Payer: Self-pay | Admitting: Cardiology

## 2024-10-06 NOTE — Telephone Encounter (Signed)
 Pt c/o Shortness Of Breath: STAT if SOB developed within the last 24 hours or pt is noticeably SOB on the phone  1. Are you currently SOB (can you hear that pt is SOB on the phone)? no  2. How long have you been experiencing SOB? A week  3. Are you SOB when sitting or when up moving around? Moving around   4. Are you currently experiencing any other symptoms?  Chest pain sometimes    Please advise.

## 2024-10-06 NOTE — Telephone Encounter (Signed)
 Pt c/o being SOB, feels like she has a little pain in her chest on L side. Unrelated to any activity, change in position, inhale or exhalation. Pt c/o SOB for several weeks. Pt also has several inhalers and a nebulizer ordered prn in her medication list. Obvious wheezing was heard over the phone and advised pt to use her nebulizer in the morning and at night, and to use inhalers q6h prn (and to use the nebulizer as soon as we hang up). Hx COPD. Pt also given ED precautions if shortness of breath worsened, or to call 911.  Pt inquiring about CT and artery scan that may need to be done sooner. Will send to Dr Ganji for any further recommendations.

## 2024-10-08 NOTE — Telephone Encounter (Signed)
 Agree will need to have her PCP at least evaluate her and if needed, I am happy to see her also

## 2024-11-03 ENCOUNTER — Encounter: Payer: Self-pay | Admitting: Cardiology

## 2024-11-03 NOTE — Telephone Encounter (Signed)
 error

## 2025-01-11 ENCOUNTER — Ambulatory Visit: Admitting: Physician Assistant

## 2025-03-05 ENCOUNTER — Encounter (HOSPITAL_COMMUNITY)
